# Patient Record
Sex: Male | Born: 1990 | Race: White | Hispanic: No | Marital: Single | Smoking: Never smoker
Health system: Southern US, Community
[De-identification: ages and names within clinical notes are randomized; demographics above are authoritative.]

---

## 2016-07-28 ENCOUNTER — Emergency Department (HOSPITAL_COMMUNITY): Payer: Medicare Other

## 2016-07-28 ENCOUNTER — Inpatient Hospital Stay (HOSPITAL_COMMUNITY)
Admission: EM | Admit: 2016-07-28 | Discharge: 2016-08-10 | DRG: 853 | Disposition: A | Discharge status: Nursing Home (Includes ICF) | Payer: Medicare Other | Attending: Internal Medicine | Admitting: Internal Medicine

## 2016-07-28 DIAGNOSIS — R6520 Severe sepsis without septic shock: Secondary | ICD-10-CM

## 2016-07-28 DIAGNOSIS — A419 Sepsis, unspecified organism: Secondary | ICD-10-CM | POA: Diagnosis present

## 2016-07-28 DIAGNOSIS — H6120 Impacted cerumen, unspecified ear: Secondary | ICD-10-CM | POA: Diagnosis present

## 2016-07-28 DIAGNOSIS — Z930 Tracheostomy status: Secondary | ICD-10-CM

## 2016-07-28 DIAGNOSIS — J9601 Acute respiratory failure with hypoxia: Secondary | ICD-10-CM

## 2016-07-28 DIAGNOSIS — L89154 Pressure ulcer of sacral region, stage 4: Secondary | ICD-10-CM | POA: Diagnosis not present

## 2016-07-28 DIAGNOSIS — S069X9A Unspecified intracranial injury with loss of consciousness of unspecified duration, initial encounter: Secondary | ICD-10-CM | POA: Diagnosis not present

## 2016-07-28 DIAGNOSIS — N390 Urinary tract infection, site not specified: Secondary | ICD-10-CM | POA: Diagnosis not present

## 2016-07-28 DIAGNOSIS — L89314 Pressure ulcer of right buttock, stage 4: Secondary | ICD-10-CM | POA: Diagnosis present

## 2016-07-28 DIAGNOSIS — T17990A Other foreign object in respiratory tract, part unspecified in causing asphyxiation, initial encounter: Secondary | ICD-10-CM | POA: Diagnosis not present

## 2016-07-28 DIAGNOSIS — R403 Persistent vegetative state: Secondary | ICD-10-CM | POA: Diagnosis present

## 2016-07-28 DIAGNOSIS — E440 Moderate protein-calorie malnutrition: Secondary | ICD-10-CM | POA: Diagnosis not present

## 2016-07-28 DIAGNOSIS — E873 Alkalosis: Secondary | ICD-10-CM | POA: Diagnosis present

## 2016-07-28 DIAGNOSIS — Z515 Encounter for palliative care: Secondary | ICD-10-CM

## 2016-07-28 DIAGNOSIS — J151 Pneumonia due to Pseudomonas: Secondary | ICD-10-CM | POA: Diagnosis present

## 2016-07-28 DIAGNOSIS — M62838 Other muscle spasm: Secondary | ICD-10-CM | POA: Diagnosis not present

## 2016-07-28 DIAGNOSIS — D649 Anemia, unspecified: Secondary | ICD-10-CM | POA: Diagnosis present

## 2016-07-28 DIAGNOSIS — Z9911 Dependence on respirator [ventilator] status: Secondary | ICD-10-CM | POA: Diagnosis not present

## 2016-07-28 DIAGNOSIS — Y95 Nosocomial condition: Secondary | ICD-10-CM

## 2016-07-28 DIAGNOSIS — F419 Anxiety disorder, unspecified: Secondary | ICD-10-CM | POA: Diagnosis present

## 2016-07-28 DIAGNOSIS — L89322 Pressure ulcer of left buttock, stage 2: Secondary | ICD-10-CM | POA: Diagnosis not present

## 2016-07-28 DIAGNOSIS — Z7401 Bed confinement status: Secondary | ICD-10-CM

## 2016-07-28 DIAGNOSIS — S069XAA Unspecified intracranial injury with loss of consciousness status unknown, initial encounter: Secondary | ICD-10-CM | POA: Diagnosis not present

## 2016-07-28 DIAGNOSIS — Z8614 Personal history of Methicillin resistant Staphylococcus aureus infection: Secondary | ICD-10-CM | POA: Diagnosis not present

## 2016-07-28 DIAGNOSIS — A310 Pulmonary mycobacterial infection: Secondary | ICD-10-CM | POA: Diagnosis not present

## 2016-07-28 DIAGNOSIS — E162 Hypoglycemia, unspecified: Secondary | ICD-10-CM | POA: Diagnosis not present

## 2016-07-28 DIAGNOSIS — J9621 Acute and chronic respiratory failure with hypoxia: Secondary | ICD-10-CM | POA: Diagnosis not present

## 2016-07-28 DIAGNOSIS — H6121 Impacted cerumen, right ear: Secondary | ICD-10-CM | POA: Diagnosis present

## 2016-07-28 DIAGNOSIS — J189 Pneumonia, unspecified organism: Secondary | ICD-10-CM | POA: Diagnosis present

## 2016-07-28 DIAGNOSIS — R109 Unspecified abdominal pain: Secondary | ICD-10-CM

## 2016-07-28 DIAGNOSIS — H04123 Dry eye syndrome of bilateral lacrimal glands: Secondary | ICD-10-CM | POA: Diagnosis present

## 2016-07-28 DIAGNOSIS — Y833 Surgical operation with formation of external stoma as the cause of abnormal reaction of the patient, or of later complication, without mention of misadventure at the time of the procedure: Secondary | ICD-10-CM | POA: Diagnosis not present

## 2016-07-28 DIAGNOSIS — R0603 Acute respiratory distress: Secondary | ICD-10-CM | POA: Diagnosis present

## 2016-07-28 DIAGNOSIS — J9622 Acute and chronic respiratory failure with hypercapnia: Secondary | ICD-10-CM | POA: Diagnosis present

## 2016-07-28 DIAGNOSIS — L8990 Pressure ulcer of unspecified site, unspecified stage: Secondary | ICD-10-CM | POA: Diagnosis present

## 2016-07-28 DIAGNOSIS — I471 Supraventricular tachycardia: Secondary | ICD-10-CM | POA: Diagnosis not present

## 2016-07-28 DIAGNOSIS — E876 Hypokalemia: Secondary | ICD-10-CM | POA: Diagnosis not present

## 2016-07-28 DIAGNOSIS — J9602 Acute respiratory failure with hypercapnia: Secondary | ICD-10-CM

## 2016-07-28 DIAGNOSIS — K9420 Gastrostomy complication, unspecified: Secondary | ICD-10-CM | POA: Diagnosis present

## 2016-07-28 DIAGNOSIS — Z931 Gastrostomy status: Secondary | ICD-10-CM

## 2016-07-28 DIAGNOSIS — K9423 Gastrostomy malfunction: Secondary | ICD-10-CM | POA: Diagnosis not present

## 2016-07-28 DIAGNOSIS — Z87820 Personal history of traumatic brain injury: Secondary | ICD-10-CM

## 2016-07-28 DIAGNOSIS — R0602 Shortness of breath: Secondary | ICD-10-CM

## 2016-07-28 DIAGNOSIS — K089 Disorder of teeth and supporting structures, unspecified: Secondary | ICD-10-CM

## 2016-07-28 DIAGNOSIS — J9690 Respiratory failure, unspecified, unspecified whether with hypoxia or hypercapnia: Secondary | ICD-10-CM

## 2016-07-28 LAB — CBC WITH DIFFERENTIAL/PLATELET
BASOS PCT: 0 %
Basophils Absolute: 0 10*3/uL (ref 0.0–0.1)
EOS ABS: 0.4 10*3/uL (ref 0.0–0.7)
EOS PCT: 4 %
HCT: 30.4 % — ABNORMAL LOW (ref 39.0–52.0)
HEMOGLOBIN: 9.2 g/dL — AB (ref 13.0–17.0)
Lymphocytes Relative: 11 %
Lymphs Abs: 1.1 10*3/uL (ref 0.7–4.0)
MCH: 28.2 pg (ref 26.0–34.0)
MCHC: 30.3 g/dL (ref 30.0–36.0)
MCV: 93.3 fL (ref 78.0–100.0)
MONOS PCT: 4 %
Monocytes Absolute: 0.4 10*3/uL (ref 0.1–1.0)
NEUTROS PCT: 81 %
Neutro Abs: 8.1 10*3/uL — ABNORMAL HIGH (ref 1.7–7.7)
PLATELETS: 157 10*3/uL (ref 150–400)
RBC: 3.26 MIL/uL — ABNORMAL LOW (ref 4.22–5.81)
RDW: 18.6 % — ABNORMAL HIGH (ref 11.5–15.5)
WBC: 10 10*3/uL (ref 4.0–10.5)

## 2016-07-28 LAB — I-STAT ARTERIAL BLOOD GAS, ED
ACID-BASE EXCESS: 9 mmol/L — AB (ref 0.0–2.0)
Bicarbonate: 36.3 mmol/L — ABNORMAL HIGH (ref 20.0–28.0)
O2 SAT: 99 %
PCO2 ART: 68.9 mmHg — AB (ref 32.0–48.0)
PH ART: 7.329 — AB (ref 7.350–7.450)
TCO2: 38 mmol/L (ref 0–100)
pO2, Arterial: 182 mmHg — ABNORMAL HIGH (ref 83.0–108.0)

## 2016-07-28 LAB — COMPREHENSIVE METABOLIC PANEL
ALK PHOS: 89 U/L (ref 38–126)
ALT: 10 U/L — AB (ref 17–63)
AST: 12 U/L — AB (ref 15–41)
Albumin: 2.5 g/dL — ABNORMAL LOW (ref 3.5–5.0)
Anion gap: 9 (ref 5–15)
BUN: 22 mg/dL — AB (ref 6–20)
CALCIUM: 7.9 mg/dL — AB (ref 8.9–10.3)
CO2: 30 mmol/L (ref 22–32)
CREATININE: 0.3 mg/dL — AB (ref 0.61–1.24)
Chloride: 100 mmol/L — ABNORMAL LOW (ref 101–111)
Glucose, Bld: 91 mg/dL (ref 65–99)
Potassium: 3.9 mmol/L (ref 3.5–5.1)
Sodium: 139 mmol/L (ref 135–145)
Total Bilirubin: 0.6 mg/dL (ref 0.3–1.2)
Total Protein: 6.2 g/dL — ABNORMAL LOW (ref 6.5–8.1)

## 2016-07-28 LAB — I-STAT CG4 LACTIC ACID, ED: Lactic Acid, Venous: 0.35 mmol/L — ABNORMAL LOW (ref 0.5–1.9)

## 2016-07-28 MED ORDER — DEXTROSE 5 % IV SOLN
1.0000 g | Freq: Three times a day (TID) | INTRAVENOUS | Status: AC
  Administered 2016-07-29 – 2016-08-09 (×35): 1 g via INTRAVENOUS
  Filled 2016-07-28 (×37): qty 1

## 2016-07-28 MED ORDER — DEXTROSE 5 % IV SOLN
2.0000 g | Freq: Once | INTRAVENOUS | Status: AC
  Administered 2016-07-28: 2 g via INTRAVENOUS
  Filled 2016-07-28: qty 2

## 2016-07-28 MED ORDER — SODIUM CHLORIDE 0.9 % IV SOLN
1000.0000 mL | INTRAVENOUS | Status: DC
  Administered 2016-07-28 – 2016-07-29 (×2): 1000 mL via INTRAVENOUS

## 2016-07-28 MED ORDER — LACTATED RINGERS IV BOLUS (SEPSIS)
2000.0000 mL | Freq: Once | INTRAVENOUS | Status: AC
  Administered 2016-07-28: 1000 mL via INTRAVENOUS

## 2016-07-28 MED ORDER — LINEZOLID 600 MG/300ML IV SOLN
600.0000 mg | Freq: Once | INTRAVENOUS | Status: AC
  Administered 2016-07-29: 600 mg via INTRAVENOUS
  Filled 2016-07-28: qty 300

## 2016-07-28 MED ORDER — GENTAMICIN SULFATE 40 MG/ML IJ SOLN
450.0000 mg | INTRAVENOUS | Status: DC
  Administered 2016-07-29 – 2016-08-05 (×9): 450 mg via INTRAVENOUS
  Filled 2016-07-28 (×9): qty 11.25

## 2016-07-28 MED ORDER — VANCOMYCIN HCL IN DEXTROSE 1-5 GM/200ML-% IV SOLN: 1000.0000 mg | Freq: Once | INTRAVENOUS | Status: DC

## 2016-07-28 NOTE — Progress Notes (Signed)
Pharmacy Antibiotic Note  Sean Palmer is a 26 y.o. male admitted from [name] on 07/28/2016 with pneumonia.  Pharmacy has been consulted for Cefepime and Gentamicin dosing. 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 Pt with urine colonization of CRE Kleb pneumo (last cx 3/31 - Intermediate sensitivity to gent/amikacin/tobramycin, Sensitive to Tigecycline, and resistant to all others)  Plan: Gentamicin 450 mg ( /kg) q24h Will f/u 10hr Gentamicin random level to determine dosing frequency Cefepime 2gm IV now then 1gm IV q8h Zyvox  IV ordered x 1 in ED - will f/u CCM decision on Zyvox vs Vancomycin Will f/u micro data, renal function, and pt's clinical condition Will contact [name] in a.m. to get more information about past abx courses     No data recorded.   Recent Labs Lab 07/28/16 2259 07/28/16 2314  WBC 10.0  --   CREATININE 0.30*  --   LATICACIDVEN  --  0.35*    CrCl cannot be calculated (Unknown ideal weight.).    Allergies  Allergen Reactions  . Vancomycin Other (See Comments)    redmans syndrome    Antimicrobials this admission: 4/7 Cefepime >>  4/7 Gent >> 4/7 Zyvox x1   Dose adjustments this admission: n/a  Microbiology results: 4/6 BCx x2:   Thank you for allowing pharmacy to be a part of this patient's care.  Christoper Fabian, PharmD, BCPS Clinical pharmacist, pager [phone] 07/28/2016 11:44 PM
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
## 2016-07-28 NOTE — ED Notes (Signed)
IV team starting IV and will get 2nd set of blood culture.

## 2016-07-29 DIAGNOSIS — E876 Hypokalemia: Secondary | ICD-10-CM | POA: Diagnosis not present

## 2016-07-29 DIAGNOSIS — J9602 Acute respiratory failure with hypercapnia: Secondary | ICD-10-CM

## 2016-07-29 DIAGNOSIS — J9622 Acute and chronic respiratory failure with hypercapnia: Secondary | ICD-10-CM | POA: Diagnosis not present

## 2016-07-29 DIAGNOSIS — L8990 Pressure ulcer of unspecified site, unspecified stage: Secondary | ICD-10-CM | POA: Diagnosis present

## 2016-07-29 DIAGNOSIS — D649 Anemia, unspecified: Secondary | ICD-10-CM | POA: Diagnosis not present

## 2016-07-29 DIAGNOSIS — Y95 Nosocomial condition: Secondary | ICD-10-CM | POA: Diagnosis not present

## 2016-07-29 DIAGNOSIS — E440 Moderate protein-calorie malnutrition: Secondary | ICD-10-CM | POA: Diagnosis not present

## 2016-07-29 DIAGNOSIS — L89314 Pressure ulcer of right buttock, stage 4: Secondary | ICD-10-CM | POA: Diagnosis not present

## 2016-07-29 DIAGNOSIS — Z87820 Personal history of traumatic brain injury: Secondary | ICD-10-CM | POA: Diagnosis not present

## 2016-07-29 DIAGNOSIS — J189 Pneumonia, unspecified organism: Secondary | ICD-10-CM | POA: Diagnosis not present

## 2016-07-29 DIAGNOSIS — F419 Anxiety disorder, unspecified: Secondary | ICD-10-CM | POA: Diagnosis not present

## 2016-07-29 DIAGNOSIS — A419 Sepsis, unspecified organism: Secondary | ICD-10-CM | POA: Diagnosis present

## 2016-07-29 DIAGNOSIS — N390 Urinary tract infection, site not specified: Secondary | ICD-10-CM | POA: Diagnosis not present

## 2016-07-29 DIAGNOSIS — I471 Supraventricular tachycardia: Secondary | ICD-10-CM | POA: Diagnosis not present

## 2016-07-29 DIAGNOSIS — R403 Persistent vegetative state: Secondary | ICD-10-CM | POA: Diagnosis not present

## 2016-07-29 DIAGNOSIS — J9601 Acute respiratory failure with hypoxia: Secondary | ICD-10-CM | POA: Diagnosis not present

## 2016-07-29 DIAGNOSIS — J9621 Acute and chronic respiratory failure with hypoxia: Secondary | ICD-10-CM | POA: Diagnosis present

## 2016-07-29 DIAGNOSIS — R0603 Acute respiratory distress: Secondary | ICD-10-CM | POA: Diagnosis present

## 2016-07-29 DIAGNOSIS — Y833 Surgical operation with formation of external stoma as the cause of abnormal reaction of the patient, or of later complication, without mention of misadventure at the time of the procedure: Secondary | ICD-10-CM | POA: Diagnosis not present

## 2016-07-29 DIAGNOSIS — L89154 Pressure ulcer of sacral region, stage 4: Secondary | ICD-10-CM | POA: Diagnosis not present

## 2016-07-29 DIAGNOSIS — Z9911 Dependence on respirator [ventilator] status: Secondary | ICD-10-CM | POA: Diagnosis not present

## 2016-07-29 DIAGNOSIS — K9423 Gastrostomy malfunction: Secondary | ICD-10-CM | POA: Diagnosis not present

## 2016-07-29 DIAGNOSIS — M62838 Other muscle spasm: Secondary | ICD-10-CM | POA: Diagnosis not present

## 2016-07-29 DIAGNOSIS — E873 Alkalosis: Secondary | ICD-10-CM | POA: Diagnosis not present

## 2016-07-29 DIAGNOSIS — J151 Pneumonia due to Pseudomonas: Secondary | ICD-10-CM | POA: Diagnosis not present

## 2016-07-29 LAB — CBC
HEMATOCRIT: 29.8 % — AB (ref 39.0–52.0)
HEMOGLOBIN: 8.7 g/dL — AB (ref 13.0–17.0)
MCH: 27.3 pg (ref 26.0–34.0)
MCHC: 29.2 g/dL — AB (ref 30.0–36.0)
MCV: 93.4 fL (ref 78.0–100.0)
Platelets: 152 10*3/uL (ref 150–400)
RBC: 3.19 MIL/uL — ABNORMAL LOW (ref 4.22–5.81)
RDW: 18.2 % — AB (ref 11.5–15.5)
WBC: 8.2 10*3/uL (ref 4.0–10.5)

## 2016-07-29 LAB — POCT I-STAT 3, ART BLOOD GAS (G3+)
ACID-BASE EXCESS: 8 mmol/L — AB (ref 0.0–2.0)
BICARBONATE: 33.9 mmol/L — AB (ref 20.0–28.0)
O2 SAT: 95 %
TCO2: 36 mmol/L (ref 0–100)
pCO2 arterial: 56.9 mmHg — ABNORMAL HIGH (ref 32.0–48.0)
pH, Arterial: 7.383 (ref 7.350–7.450)
pO2, Arterial: 78 mmHg — ABNORMAL LOW (ref 83.0–108.0)

## 2016-07-29 LAB — BASIC METABOLIC PANEL
ANION GAP: 11 (ref 5–15)
BUN: 19 mg/dL (ref 6–20)
CHLORIDE: 99 mmol/L — AB (ref 101–111)
CO2: 30 mmol/L (ref 22–32)
Calcium: 8.1 mg/dL — ABNORMAL LOW (ref 8.9–10.3)
Creatinine, Ser: <0.3 mg/dL — ABNORMAL LOW (ref 0.61–1.24)
GLUCOSE: 95 mg/dL (ref 65–99)
Potassium: 3.6 mmol/L (ref 3.5–5.1)
Sodium: 140 mmol/L (ref 135–145)

## 2016-07-29 LAB — HIV ANTIBODY (ROUTINE TESTING W REFLEX): HIV Screen 4th Generation wRfx: NONREACTIVE

## 2016-07-29 LAB — URINALYSIS, ROUTINE W REFLEX MICROSCOPIC
BILIRUBIN URINE: NEGATIVE
Glucose, UA: NEGATIVE mg/dL
Ketones, ur: 20 mg/dL — AB
Nitrite: NEGATIVE
PH: 8 (ref 5.0–8.0)
Protein, ur: 100 mg/dL — AB
SQUAMOUS EPITHELIAL / LPF: NONE SEEN
Specific Gravity, Urine: 1.016 (ref 1.005–1.030)

## 2016-07-29 LAB — MRSA PCR SCREENING: MRSA BY PCR: NEGATIVE

## 2016-07-29 LAB — GLUCOSE, CAPILLARY
GLUCOSE-CAPILLARY: 86 mg/dL (ref 65–99)
GLUCOSE-CAPILLARY: 104 mg/dL — AB (ref 65–99)
GLUCOSE-CAPILLARY: 89 mg/dL (ref 65–99)
Glucose-Capillary: 85 mg/dL (ref 65–99)

## 2016-07-29 LAB — MAGNESIUM
MAGNESIUM: 1.8 mg/dL (ref 1.7–2.4)
MAGNESIUM: 1.9 mg/dL (ref 1.7–2.4)

## 2016-07-29 LAB — PHOSPHORUS
Phosphorus: 3.1 mg/dL (ref 2.5–4.6)
Phosphorus: 3.4 mg/dL (ref 2.5–4.6)

## 2016-07-29 LAB — PROCALCITONIN: Procalcitonin: 0.44 ng/mL

## 2016-07-29 LAB — I-STAT CG4 LACTIC ACID, ED: LACTIC ACID, VENOUS: 0.46 mmol/L — AB (ref 0.5–1.9)

## 2016-07-29 MED ORDER — SODIUM CHLORIDE 0.9 % IV SOLN
1000.0000 mL | INTRAVENOUS | Status: DC
  Administered 2016-07-29 – 2016-07-30 (×3): 1000 mL via INTRAVENOUS

## 2016-07-29 MED ORDER — ENOXAPARIN SODIUM 40 MG/0.4ML ~~LOC~~ SOLN
40.0000 mg | SUBCUTANEOUS | Status: DC
  Administered 2016-07-29 – 2016-08-10 (×13): 40 mg via SUBCUTANEOUS
  Filled 2016-07-29 (×14): qty 0.4

## 2016-07-29 MED ORDER — CHLORHEXIDINE GLUCONATE 0.12% ORAL RINSE (MEDLINE KIT)
15.0000 mL | Freq: Two times a day (BID) | OROMUCOSAL | Status: DC
  Administered 2016-07-29 – 2016-08-10 (×24): 15 mL via OROMUCOSAL

## 2016-07-29 MED ORDER — ORAL CARE MOUTH RINSE
15.0000 mL | Freq: Four times a day (QID) | OROMUCOSAL | Status: DC
  Administered 2016-07-29 – 2016-08-10 (×49): 15 mL via OROMUCOSAL

## 2016-07-29 MED ORDER — DIAZEPAM 5 MG PO TABS
5.0000 mg | ORAL_TABLET | Freq: Four times a day (QID) | ORAL | Status: DC | PRN
  Administered 2016-07-30 – 2016-08-09 (×5): 5 mg
  Filled 2016-07-29 (×5): qty 1

## 2016-07-29 MED ORDER — IPRATROPIUM-ALBUTEROL 0.5-2.5 (3) MG/3ML IN SOLN
3.0000 mL | RESPIRATORY_TRACT | Status: DC
  Administered 2016-07-29 – 2016-08-05 (×43): 3 mL via RESPIRATORY_TRACT
  Filled 2016-07-29 (×41): qty 3

## 2016-07-29 MED ORDER — SODIUM CHLORIDE 0.9 % IV SOLN
250.0000 mL | INTRAVENOUS | Status: DC | PRN
  Administered 2016-08-04 – 2016-08-08 (×3): 250 mL via INTRAVENOUS

## 2016-07-29 MED ORDER — TOBRAMYCIN 0.3 % OP SOLN
2.0000 [drp] | Freq: Four times a day (QID) | OPHTHALMIC | Status: DC
  Administered 2016-07-30 – 2016-08-10 (×47): 2 [drp] via OPHTHALMIC
  Filled 2016-07-29 (×2): qty 5

## 2016-07-29 MED ORDER — BUDESONIDE 0.5 MG/2ML IN SUSP
0.5000 mg | Freq: Two times a day (BID) | RESPIRATORY_TRACT | Status: DC
  Administered 2016-07-29 – 2016-08-10 (×25): 0.5 mg via RESPIRATORY_TRACT
  Filled 2016-07-29 (×26): qty 2

## 2016-07-29 MED ORDER — OCTREOTIDE ACETATE 100 MCG/ML IJ SOLN
100.0000 ug | Freq: Every day | INTRAMUSCULAR | Status: DC
  Administered 2016-07-30 – 2016-08-10 (×12): 100 ug via SUBCUTANEOUS
  Filled 2016-07-29 (×14): qty 1

## 2016-07-29 MED ORDER — ACETYLCYSTEINE 20 % IN SOLN
4.0000 mL | Freq: Three times a day (TID) | RESPIRATORY_TRACT | Status: DC
  Administered 2016-07-29 – 2016-08-01 (×10): 4 mL via RESPIRATORY_TRACT
  Filled 2016-07-29 (×11): qty 4

## 2016-07-29 MED ORDER — PRO-STAT SUGAR FREE PO LIQD
30.0000 mL | Freq: Two times a day (BID) | ORAL | Status: DC
  Administered 2016-07-29: 30 mL
  Filled 2016-07-29 (×2): qty 30

## 2016-07-29 MED ORDER — PANTOPRAZOLE SODIUM 40 MG PO TBEC
40.0000 mg | DELAYED_RELEASE_TABLET | Freq: Every day | ORAL | Status: DC
  Administered 2016-07-29: 40 mg via ORAL
  Filled 2016-07-29 (×3): qty 1

## 2016-07-29 MED ORDER — LINEZOLID 600 MG/300ML IV SOLN
600.0000 mg | Freq: Two times a day (BID) | INTRAVENOUS | Status: DC
  Administered 2016-07-29 – 2016-07-30 (×4): 600 mg via INTRAVENOUS
  Filled 2016-07-29 (×6): qty 300

## 2016-07-29 MED ORDER — SODIUM CHLORIDE 0.9 % IV BOLUS (SEPSIS)
1000.0000 mL | Freq: Once | INTRAVENOUS | Status: AC
  Administered 2016-07-29: 1000 mL via INTRAVENOUS

## 2016-07-29 MED ORDER — IPRATROPIUM-ALBUTEROL 0.5-2.5 (3) MG/3ML IN SOLN
3.0000 mL | Freq: Four times a day (QID) | RESPIRATORY_TRACT | Status: DC | PRN
  Administered 2016-07-30: 3 mL via RESPIRATORY_TRACT
  Filled 2016-07-29 (×2): qty 3

## 2016-07-29 MED ORDER — BACLOFEN 10 MG PO TABS
15.0000 mg | ORAL_TABLET | Freq: Three times a day (TID) | ORAL | Status: DC
  Administered 2016-07-29 – 2016-08-10 (×28): 15 mg
  Filled 2016-07-29 (×40): qty 1

## 2016-07-29 MED ORDER — LACTATED RINGERS IV SOLN
INTRAVENOUS | Status: DC
  Administered 2016-07-29: 01:00:00 via INTRAVENOUS

## 2016-07-29 MED ORDER — VITAL HIGH PROTEIN PO LIQD
1000.0000 mL | ORAL | Status: DC
  Administered 2016-07-29: 1000 mL

## 2016-07-29 MED ORDER — FREE WATER
50.0000 mL | Freq: Four times a day (QID) | Status: DC
  Administered 2016-07-29 – 2016-08-05 (×28): 50 mL

## 2016-07-29 MED ORDER — VITAL AF 1.2 CAL PO LIQD
1000.0000 mL | ORAL | Status: DC
  Administered 2016-07-29 – 2016-08-09 (×9): 1000 mL
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
## 2016-07-29 NOTE — Progress Notes (Signed)
eLink Physician-Brief Progress Note Patient Name: Sean Palmer DOB: September 23, 1990 MRN: [id number]   Date of Service  07/29/2016  HPI/Events of Note  Nurse reports that L eye is red and has discharge. DDx: Corneal Abrasion vs Conjunctivitis.   eICU Interventions  Will order: 1. Tobramycin eye drops 2 drops to L eye Q 6 hours.     Intervention Category Major Interventions: Infection - evaluation and management  Becky Colan Eugene 07/29/2016, 9:19 PM

## 2016-07-29 NOTE — Progress Notes (Signed)
Began to perform patient CPT.  Patient was tolerating at the beginning however further into CPT, patient began to brady down and blood pressure began to drop.  CPT stopped and heart rate normalized and blood pressure returned to normal.  MD is aware.  Will continue to monitor.

## 2016-07-29 NOTE — ED Notes (Addendum)
Pt received 1 liter NS bolus at [name] before transport. Pt was given additional 1 liter of NS by [facility] en route.  Per Dr. Rhunette Croft give 1 liter LR bolus and the 2nd liter LR as maintenance at 125 mL/hr

## 2016-07-29 NOTE — Progress Notes (Signed)
CPT not performed due to Patient have bradycardic episodes into the 40's. MD aware. RT will continue to monitor.
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
## 2016-07-30 ENCOUNTER — Inpatient Hospital Stay (HOSPITAL_COMMUNITY): Payer: Medicare Other

## 2016-07-30 ENCOUNTER — Encounter (HOSPITAL_COMMUNITY): Payer: Self-pay | Admitting: *Deleted

## 2016-07-30 DIAGNOSIS — Z9911 Dependence on respirator [ventilator] status: Secondary | ICD-10-CM | POA: Diagnosis not present

## 2016-07-30 DIAGNOSIS — R0603 Acute respiratory distress: Secondary | ICD-10-CM | POA: Diagnosis not present

## 2016-07-30 DIAGNOSIS — A419 Sepsis, unspecified organism: Secondary | ICD-10-CM | POA: Diagnosis not present

## 2016-07-30 DIAGNOSIS — J189 Pneumonia, unspecified organism: Secondary | ICD-10-CM | POA: Diagnosis not present

## 2016-07-30 DIAGNOSIS — J9601 Acute respiratory failure with hypoxia: Secondary | ICD-10-CM | POA: Diagnosis not present

## 2016-07-30 DIAGNOSIS — J9602 Acute respiratory failure with hypercapnia: Secondary | ICD-10-CM | POA: Diagnosis not present

## 2016-07-30 LAB — GLUCOSE, CAPILLARY
GLUCOSE-CAPILLARY: 84 mg/dL (ref 65–99)
GLUCOSE-CAPILLARY: 91 mg/dL (ref 65–99)
GLUCOSE-CAPILLARY: 115 mg/dL — AB (ref 65–99)
GLUCOSE-CAPILLARY: 155 mg/dL — AB (ref 65–99)
Glucose-Capillary: 70 mg/dL (ref 65–99)
Glucose-Capillary: 76 mg/dL (ref 65–99)

## 2016-07-30 LAB — BASIC METABOLIC PANEL
ANION GAP: 8 (ref 5–15)
BUN: 13 mg/dL (ref 6–20)
CHLORIDE: 102 mmol/L (ref 101–111)
CO2: 32 mmol/L (ref 22–32)
Calcium: 8.1 mg/dL — ABNORMAL LOW (ref 8.9–10.3)
Creatinine, Ser: <0.3 mg/dL — ABNORMAL LOW (ref 0.61–1.24)
GLUCOSE: 89 mg/dL (ref 65–99)
POTASSIUM: 3 mmol/L — AB (ref 3.5–5.1)
Sodium: 142 mmol/L (ref 135–145)

## 2016-07-30 LAB — CBC
HCT: 26.1 % — ABNORMAL LOW (ref 39.0–52.0)
Hemoglobin: 7.8 g/dL — ABNORMAL LOW (ref 13.0–17.0)
MCH: 27.5 pg (ref 26.0–34.0)
MCHC: 29.9 g/dL — ABNORMAL LOW (ref 30.0–36.0)
MCV: 91.9 fL (ref 78.0–100.0)
Platelets: 128 10*3/uL — ABNORMAL LOW (ref 150–400)
RBC: 2.84 MIL/uL — AB (ref 4.22–5.81)
RDW: 18 % — ABNORMAL HIGH (ref 11.5–15.5)
WBC: 3.8 10*3/uL — AB (ref 4.0–10.5)

## 2016-07-30 LAB — POCT I-STAT 3, ART BLOOD GAS (G3+)
ACID-BASE EXCESS: 12 mmol/L — AB (ref 0.0–2.0)
Bicarbonate: 34 mmol/L — ABNORMAL HIGH (ref 20.0–28.0)
O2 SAT: 99 %
PCO2 ART: 34.5 mmHg (ref 32.0–48.0)
PH ART: 7.602 — AB (ref 7.350–7.450)
Patient temperature: 98.7
TCO2: 35 mmol/L (ref 0–100)
pO2, Arterial: 122 mmHg — ABNORMAL HIGH (ref 83.0–108.0)

## 2016-07-30 LAB — PROCALCITONIN: Procalcitonin: 0.74 ng/mL

## 2016-07-30 LAB — PHOSPHORUS
PHOSPHORUS: 3.1 mg/dL (ref 2.5–4.6)
Phosphorus: 3.4 mg/dL (ref 2.5–4.6)

## 2016-07-30 LAB — GENTAMICIN LEVEL, RANDOM: GENTAMICIN RM: 1.4 ug/mL

## 2016-07-30 LAB — MAGNESIUM
MAGNESIUM: 1.5 mg/dL — AB (ref 1.7–2.4)
Magnesium: 1.8 mg/dL (ref 1.7–2.4)

## 2016-07-30 MED ORDER — CHLORHEXIDINE GLUCONATE CLOTH 2 % EX PADS
6.0000 | MEDICATED_PAD | Freq: Every day | CUTANEOUS | Status: DC
Start: 2016-07-30 — End: 2016-08-10
  Administered 2016-07-30 – 2016-08-10 (×10): 6 via TOPICAL

## 2016-07-30 MED ORDER — FENTANYL CITRATE (PF) 100 MCG/2ML IJ SOLN
75.0000 ug | INTRAMUSCULAR | Status: DC | PRN
  Administered 2016-07-30 – 2016-08-08 (×9): 75 ug via INTRAVENOUS
  Filled 2016-07-30 (×10): qty 2

## 2016-07-30 MED ORDER — MIDAZOLAM HCL 2 MG/2ML IJ SOLN
2.0000 mg | Freq: Once | INTRAMUSCULAR | Status: AC
  Administered 2016-07-30: 2 mg via INTRAVENOUS

## 2016-07-30 MED ORDER — POTASSIUM CHLORIDE 20 MEQ/15ML (10%) PO SOLN
40.0000 meq | Freq: Once | ORAL | Status: AC
  Administered 2016-07-30: 40 meq
  Filled 2016-07-30: qty 30

## 2016-07-30 MED ORDER — PANTOPRAZOLE SODIUM 40 MG PO PACK
40.0000 mg | PACK | Freq: Every day | ORAL | Status: DC
Start: 2016-07-30 — End: 2016-08-10
  Administered 2016-07-30 – 2016-08-10 (×10): 40 mg
  Filled 2016-07-30 (×11): qty 20

## 2016-07-30 MED ORDER — FENTANYL CITRATE (PF) 100 MCG/2ML IJ SOLN
100.0000 ug | Freq: Once | INTRAMUSCULAR | Status: AC
  Administered 2016-07-30: 100 ug via INTRAVENOUS

## 2016-07-30 MED ORDER — SODIUM CHLORIDE 0.9 % IV SOLN
1.0000 g | Freq: Once | INTRAVENOUS | Status: AC
  Administered 2016-07-30: 1 g via INTRAVENOUS
  Filled 2016-07-30: qty 10

## 2016-07-30 MED ORDER — MAGNESIUM SULFATE 2 GM/50ML IV SOLN
2.0000 g | Freq: Once | INTRAVENOUS | Status: AC
  Administered 2016-07-30: 2 g via INTRAVENOUS
  Filled 2016-07-30: qty 50

## 2016-07-30 MED ORDER — MIDAZOLAM HCL 2 MG/2ML IJ SOLN
INTRAMUSCULAR | Status: AC
  Filled 2016-07-30: qty 2

## 2016-07-30 MED ORDER — CALCIUM GLUCONATE 10 % IV SOLN: 1.0000 g | Freq: Once | INTRAVENOUS | Status: DC

## 2016-07-30 NOTE — Progress Notes (Signed)
Mag 1.5, ordered 2 g mag sulfate.
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
## 2016-07-30 NOTE — Procedures (Signed)
Bronchoscopy Procedure Note Sean Palmer [id number] [birth year]  Procedure: Bronchoscopy Indications: Diagnostic evaluation of the airways.    Pt with chronic trache, with HCAP, white out left lung.  R/O lung collapse.   Procedure Details Consent: Risks of procedure as well as the alternatives and risks of each were explained to the (patient/caregiver).  Consent for procedure obtained. Time Out: Verified patient identification, verified procedure, site/side was marked, verified correct patient position, special equipment/implants available, medications/allergies/relevent history reviewed, required imaging and test results available.  Performed  In preparation for procedure, patient was given 100% FiO2 and bronchoscope lubricated. Sedation: Benzodiazepines  Airway entered and the following bronchi were examined: RUL, RML, RLL, LUL and Bronchi.   LUL and lingula were papent. Purulent secretions suctioned out from L lung.  LL lobectomy stump seen.  R lung was patnet (RUL, RML, RLL).  Purulent secretions were also seen and suctioned out.   Procedures performed: BAL from lingula.  Bronchoscope removed.    Evaluation Hemodynamic Status: BP stable throughout; O2 sats: stable throughout Patient's Current Condition: stable Specimens:  Sent purulent fluid Complications: No apparent complications Patient did tolerate procedure well.   [place] 07/30/2016

## 2016-07-30 NOTE — Progress Notes (Signed)
Pt skin surrounding G/J tube is red, warm, and has purulent/ black drainage. MD at bedside and aware. Dressing changed. Will continue to monitor.  Roselie Awkward, RN

## 2016-07-30 NOTE — Progress Notes (Signed)
Pharmacy Antibiotic Note  Sean Palmer is a 26 y.o. male admitted from [facility] on 07/28/2016 with pneumonia.  Pharmacy has been consulted for Cefepime and Gentamicin dosing. 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  Gentamicin level was drawn 10 hours after dose and is 1.4 mcg/mL. Will continue gentamicin at q24h dosing based on [place] [facility].  Plan: Gentamicin 450 mg (24m/kg) q24h Cefepime 1gm IV q8h Zyvox 6040mIV q12h Will f/u micro data, renal function, and pt's clinical condition  Height: _0  (162.6 cm) Weight: 140 lb 6.9 oz (63.7 kg) IBW/kg (Calculated) : 59.2  Temp (24hrs), Avg:98 F (36.7 C), Min:97.2 F (36.2 C), Max:98.7 F (37.1 C)   Recent Labs Lab 07/28/16 2259 07/28/16 2314 07/29/16 0221 07/29/16 0240 07/30/16 0414 07/30/16 1004  WBC 10.0  --  8.2  --  3.8*  --   CREATININE 0.30*  --  <0.30*  --  <0.30*  --   LATICACIDVEN  --  0.35*  --  0.46*  --   --   GENTRANDOM  --   --   --   --   --  1.4    CrCl cannot be calculated (This lab value cannot be used to calculate CrCl because it is not a number: <0.30).    Allergies  Allergen Reactions  . Vancomycin Other (See Comments)    redmans syndrome    Antimicrobials this admission: 4/7 Cefepime >>  4/7 Gent >> 4/7 Zyvox >>  Dose adjustments this admission: 4/8 Gent Level: 1.4 mcg/mL - continue q24h dosing  Microbiology results: 4/6 BCx x2: 1/2 GPC in clusters 4/7 TA: pending 4/7 MRSA PCR: neg 4/8 BAL: pending  CoAndrey CotaBaDiona FoleyPharmD, BCPS Clinical Pharmacist #[id number]/11/2016 1:47 PM

## 2016-07-30 NOTE — Progress Notes (Signed)
eLink Physician-Brief Progress Note Patient Name: Sean Palmer DOB: 1990/06/20 MRN: [id number]   Date of Service  07/30/2016  HPI/Events of Note  Metabolic alkalosis with pH 7.6/34/122/34  eICU Interventions  Plan: Decrease RR on vent to 18     Intervention Category Major Interventions: Acid-Base disturbance - evaluation and management  DETERDING,ELIZABETH 07/30/2016, 4:23 AM

## 2016-07-31 ENCOUNTER — Inpatient Hospital Stay (HOSPITAL_COMMUNITY): Payer: Medicare Other

## 2016-07-31 DIAGNOSIS — J9601 Acute respiratory failure with hypoxia: Secondary | ICD-10-CM | POA: Diagnosis not present

## 2016-07-31 DIAGNOSIS — R0603 Acute respiratory distress: Secondary | ICD-10-CM | POA: Diagnosis not present

## 2016-07-31 DIAGNOSIS — J9602 Acute respiratory failure with hypercapnia: Secondary | ICD-10-CM | POA: Diagnosis not present

## 2016-07-31 DIAGNOSIS — A419 Sepsis, unspecified organism: Secondary | ICD-10-CM | POA: Diagnosis not present

## 2016-07-31 LAB — CBC
HCT: 26.7 % — ABNORMAL LOW (ref 39.0–52.0)
Hemoglobin: 8 g/dL — ABNORMAL LOW (ref 13.0–17.0)
MCH: 27.9 pg (ref 26.0–34.0)
MCHC: 30 g/dL (ref 30.0–36.0)
MCV: 93 fL (ref 78.0–100.0)
PLATELETS: 139 10*3/uL — AB (ref 150–400)
RBC: 2.87 MIL/uL — ABNORMAL LOW (ref 4.22–5.81)
RDW: 18.3 % — AB (ref 11.5–15.5)
WBC: 4.3 10*3/uL (ref 4.0–10.5)

## 2016-07-31 LAB — BLOOD GAS, ARTERIAL
Acid-Base Excess: 7.4 mmol/L — ABNORMAL HIGH (ref 0.0–2.0)
Bicarbonate: 33 mmol/L — ABNORMAL HIGH (ref 20.0–28.0)
Drawn by: 24513
FIO2: 40
LHR: 16 {breaths}/min
O2 SAT: 95.5 %
PATIENT TEMPERATURE: 98.3
PCO2 ART: 61.2 mmHg — AB (ref 32.0–48.0)
PEEP: 5 cmH2O
VT: 500 mL
pH, Arterial: 7.35 (ref 7.350–7.450)
pO2, Arterial: 77.6 mmHg — ABNORMAL LOW (ref 83.0–108.0)

## 2016-07-31 LAB — GLUCOSE, CAPILLARY
GLUCOSE-CAPILLARY: 85 mg/dL (ref 65–99)
GLUCOSE-CAPILLARY: 94 mg/dL (ref 65–99)
GLUCOSE-CAPILLARY: 74 mg/dL (ref 65–99)
GLUCOSE-CAPILLARY: 75 mg/dL (ref 65–99)
Glucose-Capillary: 65 mg/dL (ref 65–99)
Glucose-Capillary: 182 mg/dL — ABNORMAL HIGH (ref 65–99)
Glucose-Capillary: 93 mg/dL (ref 65–99)

## 2016-07-31 LAB — BASIC METABOLIC PANEL
Anion gap: 6 (ref 5–15)
BUN: 8 mg/dL (ref 6–20)
CALCIUM: 8.1 mg/dL — AB (ref 8.9–10.3)
CO2: 33 mmol/L — ABNORMAL HIGH (ref 22–32)
Chloride: 104 mmol/L (ref 101–111)
GLUCOSE: 106 mg/dL — AB (ref 65–99)
Potassium: 3 mmol/L — ABNORMAL LOW (ref 3.5–5.1)
Sodium: 143 mmol/L (ref 135–145)

## 2016-07-31 LAB — PHOSPHORUS: Phosphorus: 3.9 mg/dL (ref 2.5–4.6)

## 2016-07-31 LAB — MAGNESIUM: Magnesium: 2.6 mg/dL — ABNORMAL HIGH (ref 1.7–2.4)

## 2016-07-31 LAB — PROCALCITONIN: Procalcitonin: 0.64 ng/mL

## 2016-07-31 MED ORDER — ACETAMINOPHEN 160 MG/5ML PO SOLN
650.0000 mg | ORAL | Status: DC | PRN
  Administered 2016-07-31 – 2016-08-04 (×2): 650 mg
  Filled 2016-07-31 (×2): qty 20.3

## 2016-07-31 MED ORDER — DEXTROSE 50 % IV SOLN
25.0000 g | Freq: Once | INTRAVENOUS | Status: AC
Start: 2016-07-31 — End: 2016-07-31
  Administered 2016-07-31: 25 g via INTRAVENOUS
  Filled 2016-07-31: qty 50

## 2016-07-31 MED ORDER — DEXTROSE-NACL 5-0.45 % IV SOLN
INTRAVENOUS | Status: DC
  Administered 2016-07-31 – 2016-08-01 (×2): via INTRAVENOUS

## 2016-07-31 MED ORDER — DEXTROSE 50 % IV SOLN: 1.0000 | Freq: Once | INTRAVENOUS | Status: AC

## 2016-07-31 MED ORDER — POTASSIUM CHLORIDE 20 MEQ/15ML (10%) PO SOLN
40.0000 meq | Freq: Two times a day (BID) | ORAL | Status: DC
  Administered 2016-07-31 – 2016-08-03 (×7): 40 meq
  Filled 2016-07-31 (×7): qty 30

## 2016-07-31 NOTE — Progress Notes (Signed)
Report given to mary beth rn at this time

## 2016-07-31 NOTE — Progress Notes (Signed)
STAFF NOTE: I, Dr Lavinia Sharps have personally reviewed patient's available data, including medical history, events of note, physical examination and test results as part of my evaluation. I have discussed with resident/NP and other care providers such as pharmacist, RN and RRT.  In addition,  I personally evaluated patient and elicited key findings of   S: increaed resp secretions needing higher fio2. SBP soft MAP 60 with HR 120 sinus; unclear baseline. Growwing pesudomonas on resp cutlure pre-bronch. Hypoglycemic this AM  O: unresponsivle - not on sedation. Seems less alert per resident On vent Contracttures  cxr - left sided consolidation - chrnic with Rt comp hyperinfilation and possible RUL opatcityy   Recent Labs Lab 07/29/16 0221 07/30/16 0414 07/31/16 0315  HGB 8.7* 7.8* 8.0*  HCT 29.8* 26.1* 26.7*  WBC 8.2 3.8* 4.3  PLT 152 128* 139*    Recent Labs Lab 07/28/16 2259 07/29/16 0221 07/29/16 1000 07/29/16 1700 07/30/16 0414 07/30/16 1517 07/31/16 0315  NA 139 140  --   --  142  --  143  K 3.9 3.6  --   --  3.0*  --  3.0*  CL 100* 99*  --   --  102  --  104  CO2 30 30  --   --  32  --  33*  GLUCOSE 91 95  --   --  89  --  106*  BUN 22* 19  --   --  13  --  8  CREATININE 0.30* <0.30*  --   --  <0.30*  --  <0.30*  CALCIUM 7.9* 8.1*  --   --  8.1*  --  8.1*  MG  --   --  1.8 1.9 1.8 1.5* 2.6*  PHOS  --   --  3.1 3.4 3.4 3.1 3.9     A: Hypokalemia - replaced Acute on chronic resp failure - likely pseudomonas  Hypotension - improved but unclear resolved Hypoglycemia  P: continue full vnet  pulm toilet Assess baseline BP by calling [facility] and Rx hypotension as needed Correct low sugar  Keep in 1 ICU 1 more day   .  Rest per NP/medical resident whose note is outlined above and that I agree with  The patient is critically ill with multiple organ systems failure and requires high complexity decision making for assessment and support, frequent evaluation and  titration of therapies, application of advanced monitoring technologies and extensive interpretation of multiple databases.   Critical Care Time devoted to patient care services described in this note is  30  Minutes. This time reflects time of care of this signee Dr Kalman Shan. This critical care time does not reflect procedure time, or teaching time or supervisory time of PA/NP/Med student/Med Resident etc but could involve care discussion time    Dr. Kalman Shan, M.D., [facility].C.P Pulmonary and Critical Care Medicine Staff Physician [facility] [place] [facility] Critical [facility] Pager: [phone], If no answer or between  15:00h - 7:00h: call [phone]  07/31/2016 9:11 AM

## 2016-07-31 NOTE — Care Management (Signed)
Pt is not appropriate for LTACH.  Pt is from longterm placement at [facility] - CSW consulted regarding family request of moving pt closer to [facility]
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
## 2016-07-31 NOTE — Progress Notes (Signed)
   07/31/16 2323  Respiratory Severity Assessment  Respiratory History 0  Breath Sounds 3  Respiratory Pattern 0  Cough 2  Chest X Ray 3  O2 Requirements 2  Level of Activity 3  Dyspnea 0  Score Total 13  Aerosolized Bronchodilators  Aerosolized bronchodilator indications Aerosolized bronchodilator indicated;Bronchospasm/Wheezing  Aerosolized bronchodilator goals Bronchodilation;Relief of shortness of SOB;Reduce WOB  Plan of care Hand held neb treatment  Oxygen Therapy  Oxygen therapy indications Oxygen therapy indicated;SOB/Increased WOB;Hypoxia;Hypoxemia  Oxygen therapy goals Treat hypoxia/hypoxemia;Treat myocardial O2 demand;Decrease WOB;Alleviate SOB  Plan of care (vent)  Respiratory Therapy Follow Up Assessment  Assessment follow up date 08/01/16  Follow-up assessment complete Yes

## 2016-07-31 NOTE — Consult Note (Addendum)
WOC Nurse wound consult note Reason for Consult: Consult requested for multiple pressure injuries. Wound type: Right inner ankle deep tissue injury; 1X1cm dark reddish purple Right outer ankle deep tissue injury; .3X.3 cm dark reddish purple Right heel deep tissue injury; .3X.3 cm dark reddish purple Left heel deep tissue injury; 1X.5 cm dark reddish purple Sacrum with chronic stage 4 pressure injury; 3.5X5X1cm,, undermining to 2 cm circumferentially. 90% red, 10% yellow, bleeds easily when touched, bone palpable with swab; mod amt tan drainage, no odor Left ischium with unstageable wound to upper location; 1X1cm 100% yellow, no odor, small amt yellow drainage Left lower ischium with stage 2 pressure injury; 2X1X.1cm, pink and moist Right ischium with chronic stage 4 pressure injury; 3.5X3,5X1cm with 1 cm undermining, mod amt tan drainage, 90% red, 10% yellow, no odor, bone palpable with swab G-tube with full thickness erosion around the insertion site; 1.8X1X.2cm, red and moist, mod amt tan drainage, no odor Pressure Injury POA: Yes Dressing procedure/placement/frequency: Pt is on a Sport low airloss bed to reduce pressure.   Foam dressings to protect heels and ankles and left ischium.   Prevalon boots on to reduce pressure to BLE.   Aquacel to absorb drainage and provide antimicrobial benefits to sacrum and right ischium and G-tube site.  No family at bedside to discuss plan of care. Please re-consult if further assistance is needed.  Thank-you,  Cammie Mcgee MSN, RN, CWOCN, [place], CNS [phone]
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
## 2016-08-01 ENCOUNTER — Inpatient Hospital Stay (HOSPITAL_COMMUNITY): Payer: Medicare Other

## 2016-08-01 DIAGNOSIS — A419 Sepsis, unspecified organism: Secondary | ICD-10-CM | POA: Diagnosis not present

## 2016-08-01 DIAGNOSIS — J189 Pneumonia, unspecified organism: Secondary | ICD-10-CM | POA: Diagnosis present

## 2016-08-01 DIAGNOSIS — Y95 Nosocomial condition: Secondary | ICD-10-CM

## 2016-08-01 DIAGNOSIS — J9602 Acute respiratory failure with hypercapnia: Secondary | ICD-10-CM | POA: Diagnosis not present

## 2016-08-01 DIAGNOSIS — R0603 Acute respiratory distress: Secondary | ICD-10-CM | POA: Diagnosis not present

## 2016-08-01 DIAGNOSIS — J9601 Acute respiratory failure with hypoxia: Secondary | ICD-10-CM | POA: Diagnosis not present

## 2016-08-01 LAB — CBC
HCT: 27.3 % — ABNORMAL LOW (ref 39.0–52.0)
Hemoglobin: 8 g/dL — ABNORMAL LOW (ref 13.0–17.0)
MCH: 27.5 pg (ref 26.0–34.0)
MCHC: 29.3 g/dL — AB (ref 30.0–36.0)
MCV: 93.8 fL (ref 78.0–100.0)
PLATELETS: 143 10*3/uL — AB (ref 150–400)
RBC: 2.91 MIL/uL — AB (ref 4.22–5.81)
RDW: 18.1 % — ABNORMAL HIGH (ref 11.5–15.5)
WBC: 6 10*3/uL (ref 4.0–10.5)

## 2016-08-01 LAB — BASIC METABOLIC PANEL
Anion gap: 6 (ref 5–15)
BUN: 7 mg/dL (ref 6–20)
CO2: 35 mmol/L — ABNORMAL HIGH (ref 22–32)
Calcium: 8.2 mg/dL — ABNORMAL LOW (ref 8.9–10.3)
Chloride: 100 mmol/L — ABNORMAL LOW (ref 101–111)
Glucose, Bld: 101 mg/dL — ABNORMAL HIGH (ref 65–99)
Potassium: 3.7 mmol/L (ref 3.5–5.1)
SODIUM: 141 mmol/L (ref 135–145)

## 2016-08-01 LAB — CULTURE, BLOOD (ROUTINE X 2): SPECIAL REQUESTS: ADEQUATE

## 2016-08-01 LAB — POCT I-STAT 3, ART BLOOD GAS (G3+)
ACID-BASE EXCESS: 14 mmol/L — AB (ref 0.0–2.0)
ACID-BASE EXCESS: 14 mmol/L — AB (ref 0.0–2.0)
Bicarbonate: 43.4 mmol/L — ABNORMAL HIGH (ref 20.0–28.0)
Bicarbonate: 42 mmol/L — ABNORMAL HIGH (ref 20.0–28.0)
O2 SAT: 96 %
O2 Saturation: 100 %
PCO2 ART: 89.8 mmHg — AB (ref 32.0–48.0)
PH ART: 7.294 — AB (ref 7.350–7.450)
PH ART: 7.336 — AB (ref 7.350–7.450)
PO2 ART: 405 mmHg — AB (ref 83.0–108.0)
Patient temperature: 98.9
TCO2: 46 mmol/L (ref 0–100)
TCO2: 44 mmol/L (ref 0–100)
pCO2 arterial: 78.7 mmHg (ref 32.0–48.0)
pO2, Arterial: 94 mmHg (ref 83.0–108.0)

## 2016-08-01 LAB — GLUCOSE, CAPILLARY
GLUCOSE-CAPILLARY: 101 mg/dL — AB (ref 65–99)
GLUCOSE-CAPILLARY: 133 mg/dL — AB (ref 65–99)
GLUCOSE-CAPILLARY: 178 mg/dL — AB (ref 65–99)
Glucose-Capillary: 86 mg/dL (ref 65–99)
Glucose-Capillary: 85 mg/dL (ref 65–99)

## 2016-08-01 LAB — MAGNESIUM: MAGNESIUM: 2 mg/dL (ref 1.7–2.4)

## 2016-08-01 LAB — PHOSPHORUS: Phosphorus: 2.8 mg/dL (ref 2.5–4.6)

## 2016-08-01 MED ORDER — ETOMIDATE 2 MG/ML IV SOLN
20.0000 mg | Freq: Once | INTRAVENOUS | Status: AC
  Administered 2016-08-01: 20 mg via INTRAVENOUS

## 2016-08-01 MED ORDER — IOPAMIDOL (ISOVUE-300) INJECTION 61%
INTRAVENOUS | Status: AC
  Filled 2016-08-01: qty 100

## 2016-08-01 MED ORDER — ACETYLCYSTEINE 20 % IN SOLN
4.0000 mL | Freq: Two times a day (BID) | RESPIRATORY_TRACT | Status: DC
  Administered 2016-08-01 – 2016-08-02 (×2): 4 mL via RESPIRATORY_TRACT
  Filled 2016-08-01 (×2): qty 4

## 2016-08-01 MED ORDER — ETOMIDATE 2 MG/ML IV SOLN
INTRAVENOUS | Status: AC
  Filled 2016-08-01: qty 10

## 2016-08-01 MED ORDER — ACETYLCYSTEINE 20 % IN SOLN
4.0000 mL | Freq: Two times a day (BID) | RESPIRATORY_TRACT | Status: DC
  Filled 2016-08-01: qty 4
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
## 2016-08-01 NOTE — Progress Notes (Signed)
VT changed to 450 per Dr. Christene Slates.

## 2016-08-01 NOTE — Progress Notes (Signed)
CPT not done at this time due to patient being cleaned up by RN.

## 2016-08-01 NOTE — Procedures (Signed)
Bedside Bronchoscopy Procedure Note YARDLEY LEKAS [id number] 1990/08/28  Procedure: Bronchoscopy Indications: Diagnostic evaluation of the airways and Remove secretions  Procedure Details: #8portex: Bite block in place: No In preparation for procedure, Patient hyper-oxygenated with 100 % FiO2 and Saline given via ETT (60 ml) Airway entered and the following bronchi were examined: RUL, RML, RLL, LUL, LLL and Bronchi.   Bronchoscope removed.    Evaluation BP (!) 105/56 (BP Location: Right Leg)   Pulse (!) 102   Temp 98.3 F (36.8 C) (Oral)   Resp 18   Ht  (1.626 m)   Wt 141 lb 15.6 oz (64.4 kg)   SpO2 96%   BMI 24.37 kg/m  Breath Sounds:Rhonch O2 sats: stable throughout Patient's Current Condition: stable Specimens:  None Complications: No apparent complications Patient did tolerate procedure well.   Cherylin Mylar 08/01/2016, 10:25 AM

## 2016-08-01 NOTE — Progress Notes (Signed)
   [facility] PCCM  > RT calls me re: abnormal abg >> 7.29/90/405 on 100%. Had mucus plug and hypoxemia this am requiring a clean out bronch.  > TV was decreased as well as RR > FiO2 decreased to 60% and o2 sats are 97%.  His MV is 10. Will increase TV to 450. Rpt abg in 1 hr.   Pollie Meyer, MD 08/01/2016, 12:39 PM [facility] Pager [phone] After 3 pm or if no answer, call [phone]

## 2016-08-01 NOTE — Progress Notes (Signed)
eLink Physician-Brief Progress Note Patient Name: AMIEL MCCAFFREY DOB: 1990-12-13 MRN: [id number]   Date of Service  08/01/2016  HPI/Events of Note  concer leakage j/g tube  eICU Interventions  Dc use Get film with contrast and assess placement     Intervention Category Intermediate Interventions: Diagnostic test evaluation  Nelda Bucks. 08/01/2016, 3:35 PM

## 2016-08-01 NOTE — Progress Notes (Signed)
This note also relates to the following rows which could not be included: SpO2 - Cannot attach notes to unvalidated device data  Changes made per MD request
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
## 2016-08-01 NOTE — Procedures (Addendum)
Bronchoscopy Procedure Note TABITHA TUPPER [id number] Jan 19, 1991  Procedure: Bronchoscopy Indications: Remove secretions  Procedure Details Consent: Unable to obtain consent because of emergent medical necessity. Time Out: Verified patient identification, verified procedure, site/side was marked, verified correct patient position, special equipment/implants available, medications/allergies/relevent history reviewed, required imaging and test results available.  Performed  In preparation for procedure, patient was given 100% FiO2. Sedation: Etomidate  Airway entered and the following bronchi were examined: RUL, RML, RLL, LLL and Bronchi.   Procedures performed: Brushings performed- NONE Bronchoscope removed.  , Patient placed back on 100% FiO2 at conclusion of procedure.    Evaluation Hemodynamic Status: BP stable throughout; O2 sats: stable throughout Patient's Current Condition: stable Specimens:  None Complications: No apparent complications Patient did tolerate procedure well.   Nelda Bucks. 08/01/2016   Emergent need, desat, concern obstruction  1. Complete collapse mucous LEFT MAIN, Left upper division, lavaged and cleared 2. Clips at stump LLL 3. Mucous mild rt main and BI  Mcarthur Rossetti. Tyson Alias, MD, FACP Pgr: [phone] [facility]

## 2016-08-02 ENCOUNTER — Inpatient Hospital Stay (HOSPITAL_COMMUNITY): Payer: Medicare Other

## 2016-08-02 DIAGNOSIS — A419 Sepsis, unspecified organism: Secondary | ICD-10-CM | POA: Diagnosis not present

## 2016-08-02 DIAGNOSIS — J9602 Acute respiratory failure with hypercapnia: Secondary | ICD-10-CM | POA: Diagnosis not present

## 2016-08-02 DIAGNOSIS — R0603 Acute respiratory distress: Secondary | ICD-10-CM | POA: Diagnosis not present

## 2016-08-02 DIAGNOSIS — J9601 Acute respiratory failure with hypoxia: Secondary | ICD-10-CM | POA: Diagnosis not present

## 2016-08-02 DIAGNOSIS — J189 Pneumonia, unspecified organism: Secondary | ICD-10-CM | POA: Diagnosis not present

## 2016-08-02 LAB — CBC
HCT: 27.4 % — ABNORMAL LOW (ref 39.0–52.0)
Hemoglobin: 8.1 g/dL — ABNORMAL LOW (ref 13.0–17.0)
MCH: 27.4 pg (ref 26.0–34.0)
MCHC: 29.6 g/dL — AB (ref 30.0–36.0)
MCV: 92.6 fL (ref 78.0–100.0)
PLATELETS: 124 10*3/uL — AB (ref 150–400)
RBC: 2.96 MIL/uL — ABNORMAL LOW (ref 4.22–5.81)
RDW: 17.9 % — AB (ref 11.5–15.5)
WBC: 5.3 10*3/uL (ref 4.0–10.5)

## 2016-08-02 LAB — BASIC METABOLIC PANEL
ANION GAP: 5 (ref 5–15)
BUN: 8 mg/dL (ref 6–20)
CALCIUM: 8.5 mg/dL — AB (ref 8.9–10.3)
CO2: 37 mmol/L — ABNORMAL HIGH (ref 22–32)
Chloride: 95 mmol/L — ABNORMAL LOW (ref 101–111)
Glucose, Bld: 109 mg/dL — ABNORMAL HIGH (ref 65–99)
Potassium: 4.2 mmol/L (ref 3.5–5.1)
SODIUM: 137 mmol/L (ref 135–145)

## 2016-08-02 LAB — GLUCOSE, CAPILLARY
GLUCOSE-CAPILLARY: 110 mg/dL — AB (ref 65–99)
GLUCOSE-CAPILLARY: 119 mg/dL — AB (ref 65–99)
GLUCOSE-CAPILLARY: 80 mg/dL (ref 65–99)
GLUCOSE-CAPILLARY: 84 mg/dL (ref 65–99)
Glucose-Capillary: 106 mg/dL — ABNORMAL HIGH (ref 65–99)
Glucose-Capillary: 164 mg/dL — ABNORMAL HIGH (ref 65–99)
Glucose-Capillary: 132 mg/dL — ABNORMAL HIGH (ref 65–99)

## 2016-08-02 LAB — PHOSPHORUS: Phosphorus: 2.5 mg/dL (ref 2.5–4.6)

## 2016-08-02 LAB — MAGNESIUM: Magnesium: 1.9 mg/dL (ref 1.7–2.4)

## 2016-08-02 MED ORDER — ACETYLCYSTEINE 20 % IN SOLN
4.0000 mL | Freq: Three times a day (TID) | RESPIRATORY_TRACT | Status: DC
  Administered 2016-08-02 – 2016-08-09 (×21): 4 mL via RESPIRATORY_TRACT
  Filled 2016-08-02 (×21): qty 4

## 2016-08-02 MED ORDER — MIDAZOLAM HCL 2 MG/2ML IJ SOLN
2.0000 mg | Freq: Once | INTRAMUSCULAR | Status: AC
  Administered 2016-08-02: 2 mg via INTRAVENOUS

## 2016-08-02 MED ORDER — MIDAZOLAM HCL 2 MG/2ML IJ SOLN
INTRAMUSCULAR | Status: AC
  Filled 2016-08-02: qty 2

## 2016-08-02 NOTE — Procedures (Signed)
Bronchoscopy Procedure Note Sean Palmer [id number] December 16, 1990  Procedure: Bronchoscopy Indications: Diagnostic evaluation of the airways  Procedure Details Consent: Risks of procedure as well as the alternatives and risks of each were explained to the (patient/caregiver).  Consent for procedure obtained. Time Out: Verified patient identification, verified procedure, site/side was marked, verified correct patient position, special equipment/implants available, medications/allergies/relevent history reviewed, required imaging and test results available.  Performed  In preparation for procedure, patient was given 100% FiO2 and bronchoscope lubricated. Sedation: Benzodiazepines received 2 mg of versed.  Pt was on fentanyl drip.   Airway entered and the following bronchi were examined: RUL, LUL and Bronchi.   Trache was N. Some purulent secretions.  R mainstem, rest of R lung and L mainstem were patent.  Some purulent secretions seen.  Procedures performed: None.  Bronchoscope removed, Patient placed back on 100% FiO2 at conclusion of procedure.    Evaluation Hemodynamic Status: BP stable throughout; O2 sats: stable throughout Patient's Current Condition: stable Specimens:  None Complications: No apparent complications Patient did tolerate procedure well.   Sean Palmer Angustura 08/02/2016

## 2016-08-02 NOTE — Progress Notes (Signed)
Contacted by micro lab. Pt tracheal aspirate and bronchial lavage being sent to [facility] for sensitivity. MD made aware.

## 2016-08-02 NOTE — Progress Notes (Signed)
Pharmacy Antibiotic Note  Sean Palmer is a 26 y.o. male admitted from [facility] on 07/28/2016 with pneumonia.  Pharmacy has been consulted for Cefepime and Gentamicin dosing. Today is D#5 ABX.   WBC down to 5.3, Afeb. BAL cultures from 4/8 growing pseudomonas. S/p BAL again 4/10. Sensitivities not available yet from microbiology.   Last 10-hr Gent level =1.4 mcg/mL. No change in renal function as measured by SCr and UOP. Will continue gentamicin at q24h dosing based on [place] Nom[facility].  Plan: Continue Gentamicin 450 mg (65m/kg) q24h Continue Cefepime 1gm IV q8h Will f/u micro data, renal function, and pt's clinical condition  Height: _0  (162.6 cm) Weight: 137 lb 9.1 oz (62.4 kg) IBW/kg (Calculated) : 59.2  Temp (24hrs), Avg:97.9 F (36.6 C), Min:96.5 F (35.8 C), Max:98.9 F (37.2 C)   Recent Labs Lab 07/28/16 2314 07/29/16 0221 07/29/16 0240 07/30/16 0414 07/30/16 1004 07/31/16 0315 08/01/16 0418 08/02/16 0334  WBC  --  8.2  --  3.8*  --  4.3 6.0 5.3  CREATININE  --  <0.30*  --  <0.30*  --  <0.30* <0.30* <0.30*  LATICACIDVEN 0.35*  --  0.46*  --   --   --   --   --   GENTRANDOM  --   --   --   --  1.4  --   --   --     CrCl cannot be calculated (This lab value cannot be used to calculate CrCl because it is not a number: <0.30).    Allergies  Allergen Reactions  . Vancomycin Other (See Comments)    redmans syndrome    Antimicrobials this admission: 4/7 Cefepime >>  4/7 Gent >> 4/7 Zyvox >> 4/8  Dose adjustments this admission: 4/8 Gent Level: 1.4 mcg/mL - continue q24h dosing  Microbiology results: 4/6 BCx x2: 1/2 GPC in clusters 4/7 TA: few psuedomonas 4/7 MRSA PCR: neg 4/8 BAL: pseudomonas 4/9 BCx: NMitchell Pharm.D. PGY1 Pharmacy Resident 4/11/20189:44 AM Pager [phone]

## 2016-08-02 NOTE — Progress Notes (Signed)
Cant not get an adequate seal around the trach flanged. Pt is intermittently losing expiratory volumes on mechanical ventilation. Pt is stable at this time, SATs are stable. Noted that trach was change today per MD/RRT. 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 RN aware. No distress or further complications noted at this time.
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
## 2016-08-02 NOTE — Procedures (Signed)
Tracheostomy Change Note  Patient Details:   Name: Sean Palmer DOB: August 14, 1990 MRN: [id number]    Airway Documentation:     Evaluation  O2 sats: stable throughout Complications: No apparent complications Patient did tolerate procedure well. Bilateral Breath Sounds: Clear    Trach changed at this time by MD at bedside.  + Etco2 color change.   Cherylin Mylar 08/02/2016, 12:30 PM

## 2016-08-02 NOTE — Care Management (Signed)
08/02/16 CM spoke with attending Dr Christene Slates - pts condition is chronic and will likely not improve - attending to relay this to father (father is requesting LTACH as discharge instead of SNF however with current information LTACH does not appear to be appropriate)  CM also discussed with physician advisor.  CSW actively pursing SNF near desired area  07/31/16 Pt is not appropriate for LTACH.  Pt is from longterm placement at [facility] - CSW consulted regarding family request of moving pt closer to [facility]

## 2016-08-02 NOTE — Procedures (Signed)
Bedside Bronchoscopy Procedure Note Sean Palmer [id number] [birth year]  Procedure: Bronchoscopy Indications: Diagnostic evaluation of the airways  Procedure Details: #8XLT Shiley Bite block in place: No In preparation for procedure, Patient hyper-oxygenated with 100 % FiO2 Airway entered and the following bronchi were examined: RUL, RML, RLL, LUL, LLL and Bronchi.   Bronchoscope removed.  , Patient placed back on 50% FiO2 at conclusion of procedure.    Evaluation BP 123/69   Pulse 98   Temp (!) 96.5 F (35.8 C) (Axillary)   Resp (!) 22   Ht  (1.626 m)   Wt 137 lb 9.1 oz (62.4 kg)   SpO2 100%   BMI 23.61 kg/m  Breath Sounds:Diminished and Rhonch O2 sats: stable throughout Patient's Current Condition: stable Specimens:  None Complications: No apparent complications Patient did tolerate procedure well.   Cherylin Mylar 08/02/2016, 12:31 PM

## 2016-08-03 DIAGNOSIS — A419 Sepsis, unspecified organism: Secondary | ICD-10-CM | POA: Diagnosis not present

## 2016-08-03 DIAGNOSIS — J9602 Acute respiratory failure with hypercapnia: Secondary | ICD-10-CM | POA: Diagnosis not present

## 2016-08-03 DIAGNOSIS — J9601 Acute respiratory failure with hypoxia: Secondary | ICD-10-CM | POA: Diagnosis not present

## 2016-08-03 DIAGNOSIS — R0603 Acute respiratory distress: Secondary | ICD-10-CM | POA: Diagnosis not present

## 2016-08-03 DIAGNOSIS — J189 Pneumonia, unspecified organism: Secondary | ICD-10-CM | POA: Diagnosis not present

## 2016-08-03 LAB — PHOSPHORUS: Phosphorus: 3.5 mg/dL (ref 2.5–4.6)

## 2016-08-03 LAB — GLUCOSE, CAPILLARY
GLUCOSE-CAPILLARY: 89 mg/dL (ref 65–99)
GLUCOSE-CAPILLARY: 98 mg/dL (ref 65–99)
GLUCOSE-CAPILLARY: 134 mg/dL — AB (ref 65–99)
Glucose-Capillary: 107 mg/dL — ABNORMAL HIGH (ref 65–99)
Glucose-Capillary: 100 mg/dL — ABNORMAL HIGH (ref 65–99)

## 2016-08-03 LAB — MAGNESIUM: Magnesium: 1.9 mg/dL (ref 1.7–2.4)

## 2016-08-03 LAB — CBC
HEMATOCRIT: 30.2 % — AB (ref 39.0–52.0)
Hemoglobin: 9 g/dL — ABNORMAL LOW (ref 13.0–17.0)
MCH: 27.3 pg (ref 26.0–34.0)
MCHC: 29.8 g/dL — AB (ref 30.0–36.0)
MCV: 91.5 fL (ref 78.0–100.0)
Platelets: 164 10*3/uL (ref 150–400)
RBC: 3.3 MIL/uL — ABNORMAL LOW (ref 4.22–5.81)
RDW: 17.5 % — AB (ref 11.5–15.5)
WBC: 8.3 10*3/uL (ref 4.0–10.5)

## 2016-08-03 LAB — BASIC METABOLIC PANEL
Anion gap: 8 (ref 5–15)
BUN: 9 mg/dL (ref 6–20)
CO2: 35 mmol/L — AB (ref 22–32)
CREATININE: 0.32 mg/dL — AB (ref 0.61–1.24)
Calcium: 8.5 mg/dL — ABNORMAL LOW (ref 8.9–10.3)
Chloride: 92 mmol/L — ABNORMAL LOW (ref 101–111)
GFR calc Af Amer: >60 mL/min (ref >60)
GFR calc non Af Amer: >60 mL/min (ref >60)
GLUCOSE: 132 mg/dL — AB (ref 65–99)
Potassium: 5 mmol/L (ref 3.5–5.1)
Sodium: 135 mmol/L (ref 135–145)

## 2016-08-03 LAB — CULTURE, BLOOD (ROUTINE X 2)
Culture: NO GROWTH
SPECIAL REQUESTS: ADEQUATE

## 2016-08-03 MED ORDER — POTASSIUM CHLORIDE 20 MEQ/15ML (10%) PO SOLN
40.0000 meq | Freq: Every day | ORAL | Status: DC
  Administered 2016-08-04 – 2016-08-10 (×5): 40 meq
  Filled 2016-08-03 (×5): qty 30
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
## 2016-08-03 NOTE — Progress Notes (Signed)
Received from 74M via bed with RT and RN.  BP 135/83, RR 24  HR-124 , O2 SATS 92%. Suctioned patient with mod amt of white secretions. Large amt of secretions noted in mouth and suctioned. HOB 37 degrees. Tube feedings at 35cc/hr with placement checked. Large dressing to abdomen noted at Peg Tube site.              Mlissa Tamayo Stopohel RN

## 2016-08-03 NOTE — Progress Notes (Signed)
RT informed RN that PT CVP replacement set up has been prepped and ready for application (at PT bedside).

## 2016-08-03 NOTE — Care Management Note (Signed)
Case Management Note  Patient Details  Name: Sean Palmer MRN: [id number] Date of Birth: [birth year]  Subjective/Objective:                    Action/Plan:   PTA from [name] (approx 1 week prior to that was at [facility] for approx 100 days).  Per CSW - father adamant about discharging to [facility] close to [place].  CM spoke with both attending and physician advisor - pt has a chronic condition and is more appropriate for SNF - however [name] LTACH will accept pt.  Palliative consulted for GOC - CM will follow up with palliative and family in collaboration with CSW attending.    Expected Discharge Date:                  Expected Discharge Plan:  Skilled Nursing Facility (from [facility])  In-House Referral:     Discharge planning Services  CM Consult  Post Acute Care Choice:    Choice offered to:     DME Arranged:    DME Agency:     HH Arranged:    HH Agency:     Status of Service:  In process, will continue to follow  If discussed at Long Length of Stay Meetings, dates discussed:    Additional Comments:  Cherylann Parr, RN 08/03/2016, 11:54 AM

## 2016-08-03 NOTE — Progress Notes (Signed)
Pt transferred from 14M to 4N6 w/o complications noted. Uneventful trip. 100%.Marland KitchenMarland Kitchen

## 2016-08-04 ENCOUNTER — Encounter (HOSPITAL_COMMUNITY): Payer: Self-pay | Admitting: *Deleted

## 2016-08-04 DIAGNOSIS — Z9911 Dependence on respirator [ventilator] status: Secondary | ICD-10-CM | POA: Diagnosis not present

## 2016-08-04 DIAGNOSIS — A419 Sepsis, unspecified organism: Secondary | ICD-10-CM | POA: Diagnosis not present

## 2016-08-04 DIAGNOSIS — R0603 Acute respiratory distress: Secondary | ICD-10-CM | POA: Diagnosis not present

## 2016-08-04 DIAGNOSIS — J9601 Acute respiratory failure with hypoxia: Secondary | ICD-10-CM | POA: Diagnosis not present

## 2016-08-04 LAB — GLUCOSE, CAPILLARY
GLUCOSE-CAPILLARY: 108 mg/dL — AB (ref 65–99)
Glucose-Capillary: 103 mg/dL — ABNORMAL HIGH (ref 65–99)
Glucose-Capillary: 109 mg/dL — ABNORMAL HIGH (ref 65–99)
Glucose-Capillary: 111 mg/dL — ABNORMAL HIGH (ref 65–99)
Glucose-Capillary: 120 mg/dL — ABNORMAL HIGH (ref 65–99)
Glucose-Capillary: 88 mg/dL (ref 65–99)

## 2016-08-04 LAB — CBC
HEMATOCRIT: 30.5 % — AB (ref 39.0–52.0)
Hemoglobin: 9.2 g/dL — ABNORMAL LOW (ref 13.0–17.0)
MCH: 27.4 pg (ref 26.0–34.0)
MCHC: 30.2 g/dL (ref 30.0–36.0)
MCV: 90.8 fL (ref 78.0–100.0)
Platelets: 174 10*3/uL (ref 150–400)
RBC: 3.36 MIL/uL — ABNORMAL LOW (ref 4.22–5.81)
RDW: 17.7 % — AB (ref 11.5–15.5)
WBC: 7.5 10*3/uL (ref 4.0–10.5)

## 2016-08-04 LAB — BASIC METABOLIC PANEL
Anion gap: 8 (ref 5–15)
BUN: 12 mg/dL (ref 6–20)
CALCIUM: 8.4 mg/dL — AB (ref 8.9–10.3)
CO2: 36 mmol/L — ABNORMAL HIGH (ref 22–32)
Chloride: 91 mmol/L — ABNORMAL LOW (ref 101–111)
Creatinine, Ser: <0.3 mg/dL — ABNORMAL LOW (ref 0.61–1.24)
GLUCOSE: 98 mg/dL (ref 65–99)
POTASSIUM: 3.6 mmol/L (ref 3.5–5.1)
Sodium: 135 mmol/L (ref 135–145)

## 2016-08-04 LAB — PHOSPHORUS: PHOSPHORUS: 4.2 mg/dL (ref 2.5–4.6)

## 2016-08-04 LAB — MAGNESIUM: Magnesium: 2 mg/dL (ref 1.7–2.4)
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
## 2016-08-04 NOTE — Progress Notes (Signed)
Chest PT not performed. Equipment unavailable. Will start tonight if equipment received from other hospital.   Sean Palmer

## 2016-08-04 NOTE — Progress Notes (Signed)
See CCM note. Pt's PEG tube pouring out liquid TF.  MD notified and ordered to hold TF at this time.  OK to give meds per MD. Will continue to follow closely and update as needed.

## 2016-08-04 NOTE — Progress Notes (Signed)
eLink Physician-Brief Progress Note Patient Name: Sean Palmer DOB: 05/27/1990 MRN: [id number]   Date of Service  08/04/2016  HPI/Events of Note  Concern again from leakage around PEG / J tube   eICU Interventions  Xray was done already which showed placement wnl of tubes  Will dc TF for now Am doc can assess need for CT in future or GI consult to ensure bulb  Etc patent and avoiding leakage      Intervention Category Minor Interventions: Routine modifications to care plan (e.g. PRN medications for pain, fever)  Nelda Bucks. 08/04/2016, 6:15 PM

## 2016-08-04 NOTE — Consult Note (Signed)
WOC consult requested for air mattress.  Order has been placed in the EMR that he should be placed on an air overlay mattress to decrease pressure after transfering out of ICU.  Called bedside nurse and she will order.  Consult for wounds was previously performed on 4/9; refer to progress notes for assessment; topical treatment orders have been provided for bedside nurses to perform. Please re-consult if further assistance is needed.  Thank-you,  Cammie Mcgee MSN, RN, CWOCN, [place], CNS [phone]

## 2016-08-04 NOTE — Care Management Note (Addendum)
Case Management Note  Patient Details  Name: Sean Palmer MRN: [id number] Date of Birth: 1991/01/14  Subjective/Objective:                    Action/Plan:   PTA from [name] (approx 1 week prior to that was at [facility] for approx 100 days).  Per CSW - father adamant about discharging to [facility] close to [place].  CM spoke with both attending and physician advisor - pt has a chronic condition and is more appropriate for SNF - however [name] will accept pt.  Palliative consulted for GOC - CM will follow up with palliative and family in collaboration with CSW attending.    Expected Discharge Date:                  Expected Discharge Plan:  Skilled Nursing Facility (from [facility])  In-House Referral:     Discharge planning Services  CM Consult  Post Acute Care Choice:    Choice offered to:     DME Arranged:    DME Agency:     HH Arranged:    HH Agency:     Status of Service:  In process, will continue to follow  If discussed at Long Length of Stay Meetings, dates discussed:    Additional Comments: Pt is not yet stable for discharge from [facility]. Dr Phillips Odor, CSW and CM had a lengthy discussion with pts father Sean Palmer via phone this afternoon.  Father expressed his frustrations with past medical care (not at [facility]).  Palliative Care comforted the father and listened to concerns, she ensured father that we will work to get pt to optimal functional level and the appropriate per pts condition facility at discharge.  CM attempted to begin discussion regarding possible discharge options once pt is deemed stable for discharge (including [name] that has agreed to take pt at discharge) -  However father again relayed frustrations with medical care in general and stated his son needed ongoing aggressive care and neither LTACH nor SNF can provide what he needs.  Planning to meet face to face with father, attending, Palliative, CSW and CM 12 noon Monday 4/16 to answer  questions, educate, establish goals of care and continue with appropriate discharge planning discussions.    Dr Phillips Odor with palliative will see pt today Cherylann Parr, RN 08/04/2016, 11:29 AM

## 2016-08-05 ENCOUNTER — Inpatient Hospital Stay (HOSPITAL_COMMUNITY): Payer: Medicare Other

## 2016-08-05 ENCOUNTER — Encounter (HOSPITAL_COMMUNITY): Payer: Self-pay | Admitting: Radiology

## 2016-08-05 DIAGNOSIS — A419 Sepsis, unspecified organism: Secondary | ICD-10-CM | POA: Diagnosis not present

## 2016-08-05 DIAGNOSIS — J9602 Acute respiratory failure with hypercapnia: Secondary | ICD-10-CM | POA: Diagnosis not present

## 2016-08-05 DIAGNOSIS — J9601 Acute respiratory failure with hypoxia: Secondary | ICD-10-CM | POA: Diagnosis not present

## 2016-08-05 DIAGNOSIS — R0603 Acute respiratory distress: Secondary | ICD-10-CM | POA: Diagnosis not present

## 2016-08-05 LAB — BASIC METABOLIC PANEL
ANION GAP: 8 (ref 5–15)
BUN: 13 mg/dL (ref 6–20)
CALCIUM: 8.5 mg/dL — AB (ref 8.9–10.3)
CO2: 35 mmol/L — ABNORMAL HIGH (ref 22–32)
Chloride: 92 mmol/L — ABNORMAL LOW (ref 101–111)
Creatinine, Ser: 0.32 mg/dL — ABNORMAL LOW (ref 0.61–1.24)
Glucose, Bld: 98 mg/dL (ref 65–99)
POTASSIUM: 3.6 mmol/L (ref 3.5–5.1)
Sodium: 135 mmol/L (ref 135–145)

## 2016-08-05 LAB — GLUCOSE, CAPILLARY
GLUCOSE-CAPILLARY: 83 mg/dL (ref 65–99)
Glucose-Capillary: 114 mg/dL — ABNORMAL HIGH (ref 65–99)
Glucose-Capillary: 121 mg/dL — ABNORMAL HIGH (ref 65–99)
Glucose-Capillary: 77 mg/dL (ref 65–99)
Glucose-Capillary: 87 mg/dL (ref 65–99)
Glucose-Capillary: 96 mg/dL (ref 65–99)

## 2016-08-05 LAB — CULTURE, BLOOD (ROUTINE X 2)
CULTURE: NO GROWTH
CULTURE: NO GROWTH
SPECIAL REQUESTS: ADEQUATE
Special Requests: ADEQUATE

## 2016-08-05 LAB — CBC
HCT: 29.4 % — ABNORMAL LOW (ref 39.0–52.0)
HEMOGLOBIN: 9.1 g/dL — AB (ref 13.0–17.0)
MCH: 27.8 pg (ref 26.0–34.0)
MCHC: 31 g/dL (ref 30.0–36.0)
MCV: 89.9 fL (ref 78.0–100.0)
Platelets: 184 10*3/uL (ref 150–400)
RBC: 3.27 MIL/uL — ABNORMAL LOW (ref 4.22–5.81)
RDW: 17.7 % — ABNORMAL HIGH (ref 11.5–15.5)
WBC: 6.7 10*3/uL (ref 4.0–10.5)

## 2016-08-05 LAB — MAGNESIUM: MAGNESIUM: 2.1 mg/dL (ref 1.7–2.4)

## 2016-08-05 LAB — PHOSPHORUS: Phosphorus: 3.4 mg/dL (ref 2.5–4.6)

## 2016-08-05 MED ORDER — SODIUM CHLORIDE 0.9 % IV BOLUS (SEPSIS)
500.0000 mL | Freq: Once | INTRAVENOUS | Status: AC
  Administered 2016-08-05: 500 mL via INTRAVENOUS

## 2016-08-05 MED ORDER — METOPROLOL TARTRATE 5 MG/5ML IV SOLN
INTRAVENOUS | Status: AC
  Administered 2016-08-05: 5 mg via INTRAVENOUS
  Filled 2016-08-05: qty 5

## 2016-08-05 MED ORDER — DILTIAZEM HCL 25 MG/5ML IV SOLN
5.0000 mg | Freq: Four times a day (QID) | INTRAVENOUS | Status: DC | PRN
  Filled 2016-08-05: qty 5

## 2016-08-05 MED ORDER — IPRATROPIUM-ALBUTEROL 0.5-2.5 (3) MG/3ML IN SOLN
3.0000 mL | Freq: Four times a day (QID) | RESPIRATORY_TRACT | Status: DC
  Administered 2016-08-05 – 2016-08-10 (×21): 3 mL via RESPIRATORY_TRACT
  Filled 2016-08-05 (×21): qty 3

## 2016-08-05 MED ORDER — IOPAMIDOL (ISOVUE-300) INJECTION 61%
INTRAVENOUS | Status: AC
  Administered 2016-08-05: 100 mL
  Filled 2016-08-05: qty 100

## 2016-08-05 MED ORDER — DILTIAZEM HCL-DEXTROSE 100-5 MG/100ML-% IV SOLN (PREMIX)
5.0000 mg/h | INTRAVENOUS | Status: DC
  Administered 2016-08-05: 5 mg/h via INTRAVENOUS
  Filled 2016-08-05: qty 100

## 2016-08-05 MED ORDER — DILTIAZEM LOAD VIA INFUSION
10.0000 mg | Freq: Once | INTRAVENOUS | Status: AC
  Administered 2016-08-05: 5 mg via INTRAVENOUS
  Filled 2016-08-05: qty 10

## 2016-08-05 MED ORDER — METOPROLOL TARTRATE 5 MG/5ML IV SOLN
5.0000 mg | INTRAVENOUS | Status: AC
  Administered 2016-08-05: 5 mg via INTRAVENOUS

## 2016-08-05 NOTE — Progress Notes (Signed)
Dr. Izola Price notified of unable to give free water or any meds per tube d/t copious drainage from around G-tube site. She states she will call general surgery for evaluation of G-tube replacement.  Continue to await CT abd/pelvis/chest for further evaluation.

## 2016-08-05 NOTE — Progress Notes (Signed)
Pt transported to CT via ventilator on 100% Fi02. Pt remained stable throughout the trip to & from CT.
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
## 2016-08-05 NOTE — Progress Notes (Signed)
Due to copious amount of drainage around G tube site, unable to give free water or medication via tube.  Md aware.  Will continue to monitor. Karena Addison T

## 2016-08-05 NOTE — Progress Notes (Signed)
Correction to prior note at 12:40 - abdominal/G-tube site drainage is yellow/green/bilious in color, not yellow/orange.  New orders received from Dr. Izola Price for 0.9NS fluid bolus; Cardizem gtt discontinued and PRN IVP orders given if needed; CT abd/pelvis/chest w/IV contrast only - RN does not need to travel w/patient per Dr. Izola Price. Present VS: 91/46 (56); P109.  Continue to monitor closely.

## 2016-08-05 NOTE — Progress Notes (Addendum)
Spoke with Tammy RN at 1235 regarding pt status.  States BP is 70/40 currently with elevated heart rate, unstable at this time.  Notified RN that unable to do PICC until BP stable.  States she will notify MD.  Currently has LUA SL PICC.

## 2016-08-05 NOTE — Progress Notes (Signed)
Notified Tammy, RN that PIV obtained until further notice regarding PICC placement.

## 2016-08-05 NOTE — Progress Notes (Signed)
BP 78/42 (51); HR 112; RR22; sats 95% - Pt remains on Cardizem gtt at /hr.  Paged Dr. Izola Price - holding Cardizem gtt at the moment; anticipate order for 0.9NS fluid bolus.  PICC team unable to exchange single lumen PICC at this time d/t low BP.  If patient needs to remain on Cardizem gtt, will need more than single lumen PICC which is what is now present.  Continue to monitor.  Abdomen continues to drain yellow/orange fluid almost continuously.  Dressing changed again at this time.

## 2016-08-05 NOTE — Progress Notes (Signed)
Stat EKG ordered to confirm rhythm; BP remains soft @ 92/59 (67); P175; $$22; sats 93% on trach/vent FiO2 40%; TV 450; P8

## 2016-08-05 NOTE — Progress Notes (Signed)
Pt became tachy 180-190s after oral care; CCMD and Elink both responding to change in telemetry; pt given Fentanyl per PRN order but no changes in rhythm or rate.  Dr. Izola Price paged - patient given Metoprolol  IVP STAT.  HR slowly decreasing - now in mid 170s.  Will monitor closely.

## 2016-08-06 DIAGNOSIS — R6520 Severe sepsis without septic shock: Secondary | ICD-10-CM | POA: Diagnosis not present

## 2016-08-06 DIAGNOSIS — A419 Sepsis, unspecified organism: Secondary | ICD-10-CM | POA: Diagnosis not present

## 2016-08-06 DIAGNOSIS — R0603 Acute respiratory distress: Secondary | ICD-10-CM | POA: Diagnosis not present

## 2016-08-06 LAB — GLUCOSE, CAPILLARY
GLUCOSE-CAPILLARY: 85 mg/dL (ref 65–99)
GLUCOSE-CAPILLARY: 84 mg/dL (ref 65–99)
GLUCOSE-CAPILLARY: 70 mg/dL (ref 65–99)
GLUCOSE-CAPILLARY: 84 mg/dL (ref 65–99)
Glucose-Capillary: 112 mg/dL — ABNORMAL HIGH (ref 65–99)
Glucose-Capillary: 78 mg/dL (ref 65–99)

## 2016-08-06 LAB — GENTAMICIN LEVEL, RANDOM: Gentamicin Rm: 8.5 ug/mL

## 2016-08-06 MED ORDER — GENTAMICIN SULFATE 40 MG/ML IJ SOLN: 400.0000 mg | INTRAVENOUS | Status: DC

## 2016-08-06 MED ORDER — BARRIER CREAM NON-SPECIFIED
1.0000 "application " | TOPICAL_CREAM | Freq: Three times a day (TID) | TOPICAL | Status: DC | PRN
  Filled 2016-08-06: qty 1

## 2016-08-06 NOTE — Progress Notes (Signed)
Per md will continue to hold medications via G tube and free water flushes.  Karena Addison T

## 2016-08-06 NOTE — Progress Notes (Signed)
Md Notified to view CT results.  Pt stable at this time. Will continue to monitor. Karena Addison T
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
## 2016-08-06 NOTE — Progress Notes (Signed)
Spoke with Tammy Rn regarding PICC placement to provide more lumens.  RN states more lumens id=s no longer needed, pt is stable, PIV working well.  States will cancel order for PICC.
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
## 2016-08-06 NOTE — Progress Notes (Signed)
Pharmacy Antibiotic Note  Sean Palmer is a 26 y.o. male admitted from [place] on 07/28/2016 with pseudomonal PNA.  Pharmacy has been consulted for Cefepime and Gentamicin dosing day 8. Sensitivities pending -WBC= 6.7, Scr= 0.32, UOp ~ 0.6 ml/kg/hr -gent level= 8.5 (gent given at midnight; level at 10: 30am) -weight varies 55-~ 61kg  Plan: Change gentamicin to 415m IV q48hr Continue Cefepime 1gm IV q8h Will f/u micro data, renal function, and pt's clinical condition  Height: _0  (162.6 cm) Weight: 122 lb (55.3 kg) IBW/kg (Calculated) : 59.2  Temp (24hrs), Avg:98.2 F (36.8 C), Min:97.1 F (36.2 C), Max:100.1 F (37.8 C)   Recent Labs Lab 08/01/16 0418 08/02/16 0334 08/03/16 0500 08/03/16 1208 08/04/16 0400 08/05/16 0348 08/06/16 1039  WBC 6.0 5.3 8.3  --  7.5 6.7  --   CREATININE <0.30* <0.30*  --  0.32* <0.30* 0.32*  --   GENTRANDOM  --   --   --   --   --   --  8.5    Estimated Creatinine Clearance: 111.4 mL/min (A) (by C-G formula based on SCr of 0.32 mg/dL (L)).    Allergies  Allergen Reactions  . Vancomycin Other (See Comments)    redmans syndrome    Antimicrobials this admission: 4/7 Cefepime >>  4/7 Gent >> 4/7 Zyvox >> 4/8  Dose adjustments this admission: 4/8 Gent Level: 1.4 mcg/mL - continue q24h dosing 4/15 gent level= 8.5 (gent given at midnight; level at 10: 30am)  Microbiology results: 4/6 BCx x2: 1/2 staph epi 4/7 TA: pseudomonas,few 4/7 MRSA PCR: neg 4/8 BAL: pseudomonas 4/9 BCx: neg   AHildred Laser Pharm D 08/06/2016 12:43 PM
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
## 2016-08-06 NOTE — Consult Note (Signed)
WOC Nurse wound consult note Reason for Consult: Left neck wound, Left ear wound Wound type:Medical equipment related pressure wound, and healing wound L ear Pressure Injury POA: No, Left neck wound appears new, not noted on admission Measurement: Left neck under trach ties, 1.0cm x 1.75cm x 0.1cm pink wound bed, moist, no odor, periwound reddened Left ear has healing pressure wound on lateral ear surface, appears older than from admission but was not noted on admission, no drainage, no odor. Wound bed: see above Drainage (amount, consistency, odor) see above Periwound:see above Dressing procedure/placement/frequency: I have provided nurses with orders for To left neck, cleanse with NS, pat dry, apply foam, change Q5d and prn soiling. Left ear left open to air, when pt on left side should have pillow propped where ear is not receiving pressure. Disccussed with bedside RN.  Pt already has orders to be placed on low air loss mattress once he is moved out to a floor. We will not follow, but will remain available to this patient, to nursing, and the medical and/or surgical teams.  Please re-consult if we need to assist further.    Barnett Hatter, RN-C, WTA-C Wound Treatment Associate

## 2016-08-07 DIAGNOSIS — K9420 Gastrostomy complication, unspecified: Secondary | ICD-10-CM

## 2016-08-07 DIAGNOSIS — A419 Sepsis, unspecified organism: Secondary | ICD-10-CM | POA: Diagnosis not present

## 2016-08-07 DIAGNOSIS — R6520 Severe sepsis without septic shock: Secondary | ICD-10-CM | POA: Diagnosis not present

## 2016-08-07 DIAGNOSIS — J9621 Acute and chronic respiratory failure with hypoxia: Secondary | ICD-10-CM | POA: Diagnosis not present

## 2016-08-07 DIAGNOSIS — J189 Pneumonia, unspecified organism: Secondary | ICD-10-CM | POA: Diagnosis not present

## 2016-08-07 DIAGNOSIS — J9622 Acute and chronic respiratory failure with hypercapnia: Secondary | ICD-10-CM | POA: Diagnosis not present

## 2016-08-07 DIAGNOSIS — Z9911 Dependence on respirator [ventilator] status: Secondary | ICD-10-CM | POA: Diagnosis not present

## 2016-08-07 DIAGNOSIS — R0603 Acute respiratory distress: Secondary | ICD-10-CM | POA: Diagnosis not present

## 2016-08-07 LAB — CULTURE, BAL-QUANTITATIVE W GRAM STAIN: Culture: >=100000 — AB

## 2016-08-07 LAB — BASIC METABOLIC PANEL
Anion gap: 10 (ref 5–15)
BUN: 16 mg/dL (ref 6–20)
CHLORIDE: 99 mmol/L — AB (ref 101–111)
CO2: 31 mmol/L (ref 22–32)
Calcium: 8.3 mg/dL — ABNORMAL LOW (ref 8.9–10.3)
Creatinine, Ser: 0.46 mg/dL — ABNORMAL LOW (ref 0.61–1.24)
GFR calc Af Amer: >60 mL/min (ref >60)
GFR calc non Af Amer: >60 mL/min (ref >60)
GLUCOSE: 87 mg/dL (ref 65–99)
Potassium: 3 mmol/L — ABNORMAL LOW (ref 3.5–5.1)
Sodium: 140 mmol/L (ref 135–145)

## 2016-08-07 LAB — CBC
HCT: 31.7 % — ABNORMAL LOW (ref 39.0–52.0)
HEMOGLOBIN: 9.6 g/dL — AB (ref 13.0–17.0)
MCH: 27.7 pg (ref 26.0–34.0)
MCHC: 30.3 g/dL (ref 30.0–36.0)
MCV: 91.6 fL (ref 78.0–100.0)
Platelets: 230 10*3/uL (ref 150–400)
RBC: 3.46 MIL/uL — AB (ref 4.22–5.81)
RDW: 17.5 % — ABNORMAL HIGH (ref 11.5–15.5)
WBC: 6.5 10*3/uL (ref 4.0–10.5)

## 2016-08-07 LAB — GLUCOSE, CAPILLARY
GLUCOSE-CAPILLARY: 85 mg/dL (ref 65–99)
GLUCOSE-CAPILLARY: 108 mg/dL — AB (ref 65–99)
Glucose-Capillary: 67 mg/dL (ref 65–99)
Glucose-Capillary: 75 mg/dL (ref 65–99)
Glucose-Capillary: 84 mg/dL (ref 65–99)
Glucose-Capillary: 95 mg/dL (ref 65–99)

## 2016-08-07 LAB — SUSCEPTIBILITY RESULT

## 2016-08-07 LAB — CULTURE, BAL-QUANTITATIVE

## 2016-08-07 LAB — CULTURE, RESPIRATORY

## 2016-08-07 LAB — CULTURE, RESPIRATORY W GRAM STAIN

## 2016-08-07 LAB — SUSCEPTIBILITY, AER + ANAEROB

## 2016-08-07 MED ORDER — OXYCODONE HCL 5 MG PO TABS
5.0000 mg | ORAL_TABLET | Freq: Three times a day (TID) | ORAL | Status: DC | PRN
  Administered 2016-08-09 (×2): 5 mg
  Filled 2016-08-07 (×2): qty 1

## 2016-08-07 MED ORDER — SODIUM CHLORIDE 0.9 % IV SOLN: 30.0000 meq | Freq: Once | INTRAVENOUS | Status: DC

## 2016-08-07 MED ORDER — DEXTROSE 50 % IV SOLN
INTRAVENOUS | Status: AC
  Administered 2016-08-07: 25 mL
  Filled 2016-08-07: qty 50

## 2016-08-07 MED ORDER — ACETAMINOPHEN 325 MG PO TABS
650.0000 mg | ORAL_TABLET | Freq: Three times a day (TID) | ORAL | Status: DC
  Administered 2016-08-08 – 2016-08-10 (×7): 650 mg
  Filled 2016-08-07 (×7): qty 2

## 2016-08-07 MED ORDER — SODIUM CHLORIDE 0.9 % IV SOLN
30.0000 meq | Freq: Once | INTRAVENOUS | Status: AC
  Administered 2016-08-07: 30 meq via INTRAVENOUS
  Filled 2016-08-07: qty 15

## 2016-08-07 MED ORDER — ZOLPIDEM TARTRATE 5 MG PO TABS
5.0000 mg | ORAL_TABLET | Freq: Every evening | ORAL | Status: DC | PRN
Start: 2016-08-07 — End: 2016-08-10
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
## 2016-08-07 NOTE — Care Management Note (Signed)
Case Management Note  Patient Details  Name: Sean Palmer MRN: [id number] Date of Birth: [birth year]  Subjective/Objective:                    Action/Plan:   PTA from [name] (approx 1 week prior to that was at [facility] for approx 100 days).  Per CSW - father adamant about discharging to [facility] close to [place].  CM spoke with both attending and physician advisor - pt has a chronic condition and is more appropriate for SNF - however [name] will accept pt.  Palliative consulted for GOC - CM will follow up with palliative and family in collaboration with [facility] attending.    Expected Discharge Date:                  Expected Discharge Plan:  [place] (from [facility])  In-House Referral:     Discharge planning Services  CM Consult  Post Acute Care Choice:    Choice offered to:     DME Arranged:    DME Agency:     HH Arranged:    HH Agency:     Status of Service:  In process, will continue to follow  If discussed at Long Length of Stay Meetings, dates discussed:    Additional Comments: 08/07/2016 CM, CSW and palliative care doctor met face to face with father and family friend.  Meeting was very friendly and all questions were answere/concerns voiced.  Palliative doctor provided realistic prognosis for pt and explained that per assessments and clinical expertise pt will not likely recover to fathers expectations.  CM and Palliative spoke with with father about next steps including optimizing health and transferring to another facility once pt is deemed medically stable including [name] or out of state SNF facility per physician advisor.  Dr Hilma Favors to follow up with pulmonary, GI and CEO at [facility] hospital to ensure a detail plan is established prior to discharge.   - [name] liaison continue to offer bed in [place] spoke directly with father towards end of meeting - father is in agreement to discharge back to [name].  08/04/16  Pt is  not yet stable for discharge from [facility]. Dr Hilma Favors, CSW and CM had a lengthy discussion with pts father Sean Palmer via phone this afternoon.  Father expressed his frustrations with past medical care (not at [facility]).  Palliative Care comforted the father and listened to concerns, she ensured father that we will work to get pt to optimal functional level and the appropriate per pts condition facility at discharge.  CM attempted to begin discussion regarding possible discharge options once pt is deemed stable for discharge (including [name] that has agreed to take pt at discharge) -  However father again relayed frustrations with medical care in general and stated his son needed ongoing aggressive care and neither LTACH nor SNF can provide what he needs.  Planning to meet face to face with father, attending, Palliative, CSW and CM 12 noon Monday 4/16 to answer questions, educate, establish goals of care and continue with appropriate discharge planning discussions.    Dr Hilma Favors with palliative will see pt today Maryclare Labrador, RN 08/07/2016, 2:56 PM
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
## 2016-08-07 NOTE — Progress Notes (Signed)
Pt has been clear and received one out of three CPT. RT didn't get any secretion from the Pt. Pt will be going to [facility] on 08/07/16.
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
## 2016-08-08 ENCOUNTER — Encounter (HOSPITAL_COMMUNITY): Payer: Self-pay | Admitting: Radiology

## 2016-08-08 ENCOUNTER — Inpatient Hospital Stay (HOSPITAL_COMMUNITY): Payer: Medicare Other

## 2016-08-08 DIAGNOSIS — J9601 Acute respiratory failure with hypoxia: Secondary | ICD-10-CM | POA: Diagnosis not present

## 2016-08-08 DIAGNOSIS — A419 Sepsis, unspecified organism: Secondary | ICD-10-CM | POA: Diagnosis not present

## 2016-08-08 DIAGNOSIS — J9602 Acute respiratory failure with hypercapnia: Secondary | ICD-10-CM | POA: Diagnosis not present

## 2016-08-08 DIAGNOSIS — R0603 Acute respiratory distress: Secondary | ICD-10-CM | POA: Diagnosis not present

## 2016-08-08 HISTORY — PX: IR FLUORO RM 30-60 MIN: IMG2384

## 2016-08-08 LAB — CBC
HCT: 32.4 % — ABNORMAL LOW (ref 39.0–52.0)
HEMOGLOBIN: 9.6 g/dL — AB (ref 13.0–17.0)
MCH: 27.1 pg (ref 26.0–34.0)
MCHC: 29.6 g/dL — AB (ref 30.0–36.0)
MCV: 91.5 fL (ref 78.0–100.0)
Platelets: 241 10*3/uL (ref 150–400)
RBC: 3.54 MIL/uL — AB (ref 4.22–5.81)
RDW: 16.8 % — ABNORMAL HIGH (ref 11.5–15.5)
WBC: 5.6 10*3/uL (ref 4.0–10.5)

## 2016-08-08 LAB — GLUCOSE, CAPILLARY
GLUCOSE-CAPILLARY: 71 mg/dL (ref 65–99)
GLUCOSE-CAPILLARY: 101 mg/dL — AB (ref 65–99)
Glucose-Capillary: 72 mg/dL (ref 65–99)
Glucose-Capillary: 94 mg/dL (ref 65–99)
Glucose-Capillary: 87 mg/dL (ref 65–99)

## 2016-08-08 LAB — BASIC METABOLIC PANEL
ANION GAP: 11 (ref 5–15)
BUN: 16 mg/dL (ref 6–20)
CHLORIDE: 102 mmol/L (ref 101–111)
CO2: 30 mmol/L (ref 22–32)
CREATININE: 0.48 mg/dL — AB (ref 0.61–1.24)
Calcium: 8.6 mg/dL — ABNORMAL LOW (ref 8.9–10.3)
GFR calc non Af Amer: >60 mL/min (ref >60)
Glucose, Bld: 73 mg/dL (ref 65–99)
POTASSIUM: 3.1 mmol/L — AB (ref 3.5–5.1)
SODIUM: 143 mmol/L (ref 135–145)

## 2016-08-08 MED ORDER — IOPAMIDOL (ISOVUE-300) INJECTION 61%
INTRAVENOUS | Status: AC
  Filled 2016-08-08: qty 50

## 2016-08-08 MED ORDER — POTASSIUM CHLORIDE CRYS ER 20 MEQ PO TBCR: 40.0000 meq | EXTENDED_RELEASE_TABLET | Freq: Once | ORAL | Status: DC

## 2016-08-08 MED ORDER — IOPAMIDOL (ISOVUE-300) INJECTION 61%
INTRAVENOUS | Status: AC
  Administered 2016-08-08: 75 mL
  Filled 2016-08-08: qty 75

## 2016-08-08 NOTE — Progress Notes (Signed)
Per IR MD, okay from his standards to resume meds per tube and TF.  Nutrition consult placed. Primary MD notified.

## 2016-08-08 NOTE — Progress Notes (Signed)
CPT done at 0900
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
## 2016-08-09 ENCOUNTER — Inpatient Hospital Stay (HOSPITAL_COMMUNITY): Payer: Medicare Other

## 2016-08-09 DIAGNOSIS — Z9911 Dependence on respirator [ventilator] status: Secondary | ICD-10-CM | POA: Diagnosis not present

## 2016-08-09 DIAGNOSIS — R0603 Acute respiratory distress: Secondary | ICD-10-CM | POA: Diagnosis not present

## 2016-08-09 DIAGNOSIS — J189 Pneumonia, unspecified organism: Secondary | ICD-10-CM | POA: Diagnosis not present

## 2016-08-09 DIAGNOSIS — A419 Sepsis, unspecified organism: Secondary | ICD-10-CM | POA: Diagnosis not present

## 2016-08-09 DIAGNOSIS — J9622 Acute and chronic respiratory failure with hypercapnia: Secondary | ICD-10-CM | POA: Diagnosis not present

## 2016-08-09 DIAGNOSIS — Z931 Gastrostomy status: Secondary | ICD-10-CM

## 2016-08-09 DIAGNOSIS — K9423 Gastrostomy malfunction: Secondary | ICD-10-CM

## 2016-08-09 DIAGNOSIS — K9420 Gastrostomy complication, unspecified: Secondary | ICD-10-CM | POA: Diagnosis not present

## 2016-08-09 DIAGNOSIS — J9621 Acute and chronic respiratory failure with hypoxia: Secondary | ICD-10-CM | POA: Diagnosis not present

## 2016-08-09 LAB — CBC
HEMATOCRIT: 32.1 % — AB (ref 39.0–52.0)
Hemoglobin: 9.6 g/dL — ABNORMAL LOW (ref 13.0–17.0)
MCH: 27.6 pg (ref 26.0–34.0)
MCHC: 29.9 g/dL — AB (ref 30.0–36.0)
MCV: 92.2 fL (ref 78.0–100.0)
PLATELETS: 265 10*3/uL (ref 150–400)
RBC: 3.48 MIL/uL — ABNORMAL LOW (ref 4.22–5.81)
RDW: 17 % — AB (ref 11.5–15.5)
WBC: 7.1 10*3/uL (ref 4.0–10.5)

## 2016-08-09 LAB — BASIC METABOLIC PANEL
Anion gap: 11 (ref 5–15)
BUN: 14 mg/dL (ref 6–20)
CALCIUM: 8.4 mg/dL — AB (ref 8.9–10.3)
CO2: 30 mmol/L (ref 22–32)
Chloride: 103 mmol/L (ref 101–111)
Creatinine, Ser: 0.51 mg/dL — ABNORMAL LOW (ref 0.61–1.24)
GFR calc non Af Amer: >60 mL/min (ref >60)
GLUCOSE: 84 mg/dL (ref 65–99)
Potassium: 3.2 mmol/L — ABNORMAL LOW (ref 3.5–5.1)
Sodium: 144 mmol/L (ref 135–145)

## 2016-08-09 LAB — GLUCOSE, CAPILLARY
GLUCOSE-CAPILLARY: 66 mg/dL (ref 65–99)
GLUCOSE-CAPILLARY: 110 mg/dL — AB (ref 65–99)
GLUCOSE-CAPILLARY: 115 mg/dL — AB (ref 65–99)
GLUCOSE-CAPILLARY: 117 mg/dL — AB (ref 65–99)
GLUCOSE-CAPILLARY: 96 mg/dL (ref 65–99)
Glucose-Capillary: 69 mg/dL (ref 65–99)
Glucose-Capillary: 76 mg/dL (ref 65–99)
Glucose-Capillary: 77 mg/dL (ref 65–99)
Glucose-Capillary: 81 mg/dL (ref 65–99)

## 2016-08-09 LAB — MAGNESIUM: Magnesium: 2 mg/dL (ref 1.7–2.4)

## 2016-08-09 MED ORDER — OFLOXACIN 0.3 % OP SOLN
10.0000 [drp] | Freq: Every day | OPHTHALMIC | Status: DC
  Administered 2016-08-09 – 2016-08-10 (×2): 10 [drp] via OTIC
  Filled 2016-08-09: qty 5

## 2016-08-09 MED ORDER — DEXTROSE 50 % IV SOLN
INTRAVENOUS | Status: AC
  Administered 2016-08-09: 25 mL
  Filled 2016-08-09: qty 50

## 2016-08-09 MED ORDER — POTASSIUM CHLORIDE 20 MEQ/15ML (10%) PO SOLN
40.0000 meq | Freq: Once | ORAL | Status: AC
  Administered 2016-08-09: 40 meq
  Filled 2016-08-09: qty 30

## 2016-08-09 MED ORDER — DEXTROSE 50 % IV SOLN
25.0000 mL | Freq: Once | INTRAVENOUS | Status: AC
  Administered 2016-08-09: 25 mL via INTRAVENOUS

## 2016-08-09 MED ORDER — POTASSIUM CHLORIDE 20 MEQ PO PACK: 40.0000 meq | PACK | Freq: Once | ORAL | Status: DC

## 2016-08-09 MED ORDER — ARTIFICIAL TEARS OP OINT
TOPICAL_OINTMENT | Freq: Three times a day (TID) | OPHTHALMIC | Status: DC
  Administered 2016-08-09 – 2016-08-10 (×4): via OPHTHALMIC
  Filled 2016-08-09 (×2): qty 3.5

## 2016-08-09 NOTE — Progress Notes (Signed)
Disposition plan is for [facility] when medically stable. [name] has agreed to accept him as a patient. CSW signing off. CSW continues to remain available as needed.    Enos Fling, MSW, LCSW [facility] ED/38M Clinical Social Worker [phone]
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
## 2016-08-09 NOTE — Care Management Note (Addendum)
Case Management Note  Patient Details  Name: GERTRUDE TARBET MRN: [id number] Date of Birth: 03-03-1991  Subjective/Objective:                    Action/Plan:   PTA from [name] (approx 1 week prior to that was at [facility] for approx 100 days).  Per CSW - father adamant about discharging to [facility] close to [place].  CM spoke with both attending and physician advisor - pt has a chronic condition and is more appropriate for SNF - however [name] will accept pt.  Palliative consulted for GOC - CM will follow up with palliative and family in collaboration with [place] attending.    Expected Discharge Date:                  Expected Discharge Plan:  [place] (from [facility])  In-House Referral:     Discharge planning Services  CM Consult  Post Acute Care Choice:    Choice offered to:     DME Arranged:    DME Agency:     HH Arranged:    HH Agency:     Status of Service:  In process, will continue to follow  If discussed at Long Length of Stay Meetings, dates discussed:    Additional Comments: 08/09/2016  1:31:  Dr Hilma Favors informed CM that she contacted bedside nurse, explained plan and confusion has been cleared regarding feeding to ensure continuity of appropriate care.   Discharge plan tentative for tomorrow to [facility].  Palliative to follow up with father regarding plan status, head CT results and tentative discharge to [facility] tomorrow.  CM confirmed with [name] that bed will be available tomorrow.  CM will continue to follow for discharge needs  11:51: CM was contacted by bedside nurse via phone stating she was not going to begin tube feeds at this time.  CM again requested that bedside nurse follow up with both attending and Palliative to gain clarity and actual orders.  CM also requested that Palliative follow up with attending and bedside nurse.  10 oclock hour;   CM went to bedside and again explained to bedside nurse that a  collaborative effort was in the works including [place] and requested that bedside nurse follow up directly with attending and Palliative regarding the appropriate feeding plan - as specifics would have to be given by MD.  CM also relayed to bedside nurse that specific parameters had been agreed to by father and Palliative to make sure pt was optimized for success at discharge - this including the feeding plan   9:20 CM spoke with bedside nurse via phone requesting clarity with tube feeding order (current order states continuous which is in contraindication of current medical plan of pt )- nurse informed CM that she believed pt was to be on continuous feeds rather than trickle feeds even though this was stated in notes and would proceed with continous.  CM text paged  both attending and Palliative.  Pt has PEG tube procedure in IR yesterday and no drainage seen as of yet  08/07/16 CM, CSW and palliative care doctor met face to face with father and family friend.  Meeting was very friendly and all questions were answere/concerns voiced.  Palliative doctor provided realistic prognosis for pt and explained that per assessments and clinical expertise pt will not likely recover to fathers expectations.  CM and Palliative spoke with with father about next steps including optimizing  health and transferring to another facility once pt is deemed medically stable including [name] or out of state SNF facility per physician advisor.  Dr Hilma Favors to follow up with pulmonary, GI and CEO at [facility] hospital to ensure a detail plan is established prior to discharge.   - [name] liaison continue to offer bed in [place] spoke directly with father towards end of meeting - father is in agreement to discharge back to [name].  08/04/16  Pt is not yet stable for discharge from [facility]. Dr Hilma Favors, CSW and CM had a lengthy discussion with pts father [name] via phone this afternoon.  Father expressed his  frustrations with past medical care (not at [facility]).  Palliative Care comforted the father and listened to concerns, she ensured father that we will work to get pt to optimal functional level and the appropriate per pts condition facility at discharge.  CM attempted to begin discussion regarding possible discharge options once pt is deemed stable for discharge (including [name] that has agreed to take pt at discharge) -  However father again relayed frustrations with medical care in general and stated his son needed ongoing aggressive care and neither LTACH nor SNF can provide what he needs.  Planning to meet face to face with father, attending, Palliative, CSW and CM 12 noon Monday 4/16 to answer questions, educate, establish goals of care and continue with appropriate discharge planning discussions.    Dr Hilma Favors with palliative will see pt today Maryclare Labrador, RN 08/09/2016, 9:18 AM

## 2016-08-09 NOTE — Significant Event (Addendum)
Flexiseal came out during a turn. Reinserted without complications. However, it continues to come out despite adjustments to the balloon inflation. As of current, patient not able to hold in a flexiseal. Existing flexiseal discarded. Sacral wound dressings changed. Unable to place a rectal pouch due to proximity of wounds. Will give patient a break from flexiseal, to allow patient to regain rectal tone.     Sean Palmer

## 2016-08-09 NOTE — Consult Note (Addendum)
WOC follow-up:  WOC consult requested to discuss possible Vac therapy. Refer to 4/9 consult for previous progress notes and wound assessment. Family member was asking if Vac dressing would promote healing to wounds and had a meeting with palliative care to discuss goals of care earlier today.  Pt is currently getting Aquacel to sacrum and right ischium wounds to absorb drainage and promote healing.  Wounds are not significantly deep; approx 1 cm to each location, so Vac therapy could be used but it would be difficult to maintain a seal if patient is incontinent, related to the location of his wounds.  His protein level should be optimized to promote wound healing and this has been problematic since feeding tube was previously leaking. Feeding tube leakage is reported to be resolved at this time by the bedside nurse, discontinued order for Aquacel to G-tube site which was ordered when area was previously red and macerated.  Pt is on an air mattress to decrease pressure.  No family members at the bedside to discuss plan of care this afternoon.  Palliative care team present and Dr Phillips Odor states she will discuss options with the family during their next meeting. Please re-consult if Vac application is desired or further assistance is needed.   Thank-you,  Cammie Mcgee MSN, RN, CWOCN, [place], CNS [phone]
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
## 2016-08-09 NOTE — Progress Notes (Signed)
Hypoglycemic Event  CBG: 66  Treatment: 25ml D50  Symptoms: none. Pt has TBI and is unable to communicate   Follow-up CBG: Time: 0205 CBG Result: 110  Possible Reasons for Event: pt is NPO and tube feeds have been stopped due to PEG tube issues
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
## 2016-08-09 NOTE — Significant Event (Signed)
RN spoke with IR Dr. Bonnielee Haff regarding if patient can be started on tube feeds, after discussing with attending Dr. Elvera Lennox this morning during rounds who gave RN verbal orders to follow up with IR for tube feeds initiation. No leakage around G/J tube noted.   Per Dr. Bonnielee Haff, RN can start trickle feeds. RN about to start trickle feeds at 1045am, when case management Samantha stopped RN and said that patient should be on bolus feeds and that this was the plan discussed with palliative and patient's father at recent family meeting and plan of goals set up. CM not able to specific how much if bolus feeds, but that it needed to be bolus, trickle feeds.   Advised CM that I am receiving conflicting information at this time based on what she is telling me and that if she is telling me these, that she would need to speak with IR MD who had given me a different order, which is to start trickle feeds. CM speaking with Palliative MD as she is discussing this with me. Per CM, Palliative MD to let RN know about the tube feeds.   1138-DrElvera Lennox called RN back and told RN to start trickle feeds. RN told Dr. Elvera Lennox about the conversations with CM that just happened recently in the last hour. MD to order dietitian consult. Will not start tube feeds until a confirmative order is in [facility] after dietitian sees patient.       Montravious Weigelt

## 2016-08-09 NOTE — Progress Notes (Addendum)
Palliative Care Progress Note  Followed up with Sean Palmer today to help facilitate goals of care established with his father on 4/16.  1. G-Tube Complications  IR has evaluated and replaced the tube  Start trickle feeds for now-short term to make sure there is no leaking or residuals-will obtain nutrition consult and request recommendations for timed bolus feeding to be initiated here and continued on discharge.  If ok over night with trickle can start bolus feeding tomorrow  2. Dental Issues-CT Maxillofacial, does show impacted wisdom teeth, but no acute infection or soft tissue swelling, he does have a completely opacified right ear canal full of cerumen, sinuses are fluid filled-limited brain imaging shows severe atrophy.  3. Respiratory: Patient has been unable to wean without distress, will need to complete full course of abx for PNA, needs Chest PT/Vest q4 while awake.  4. Pain is better controlled, spacticity persists.  Disposition plan is for [facility] when medically stable. [name] has agreed to accept him as a patient. I will contact his father to update him on the goals for this hospitalization. I will also share with him the severe brain atrophy seen on CT.  Anderson Malta, DO Palliative Medicine [phone]  Time:25 minutes Greater than 50%  of this time was spent counseling and coordinating care related to the above assessment and plan.  CT showed cerumen impaction of right ear. Using gentle suction and irrigation- a very large amount of cerumen and debris was removed from his right ear canal. Will add on ear gtt, external otitis infection prophylaxis.

## 2016-08-10 DIAGNOSIS — K9420 Gastrostomy complication, unspecified: Secondary | ICD-10-CM | POA: Diagnosis not present

## 2016-08-10 DIAGNOSIS — Z931 Gastrostomy status: Secondary | ICD-10-CM

## 2016-08-10 DIAGNOSIS — J9622 Acute and chronic respiratory failure with hypercapnia: Secondary | ICD-10-CM | POA: Diagnosis not present

## 2016-08-10 DIAGNOSIS — S069XAA Unspecified intracranial injury with loss of consciousness status unknown, initial encounter: Secondary | ICD-10-CM | POA: Diagnosis not present

## 2016-08-10 DIAGNOSIS — A419 Sepsis, unspecified organism: Secondary | ICD-10-CM | POA: Diagnosis not present

## 2016-08-10 DIAGNOSIS — R0603 Acute respiratory distress: Secondary | ICD-10-CM | POA: Diagnosis not present

## 2016-08-10 DIAGNOSIS — R403 Persistent vegetative state: Secondary | ICD-10-CM | POA: Diagnosis present

## 2016-08-10 DIAGNOSIS — Z515 Encounter for palliative care: Secondary | ICD-10-CM

## 2016-08-10 DIAGNOSIS — A310 Pulmonary mycobacterial infection: Secondary | ICD-10-CM

## 2016-08-10 DIAGNOSIS — H04123 Dry eye syndrome of bilateral lacrimal glands: Secondary | ICD-10-CM | POA: Diagnosis present

## 2016-08-10 DIAGNOSIS — Z9911 Dependence on respirator [ventilator] status: Secondary | ICD-10-CM

## 2016-08-10 DIAGNOSIS — Z930 Tracheostomy status: Secondary | ICD-10-CM

## 2016-08-10 DIAGNOSIS — H6120 Impacted cerumen, unspecified ear: Secondary | ICD-10-CM | POA: Diagnosis present

## 2016-08-10 DIAGNOSIS — J9621 Acute and chronic respiratory failure with hypoxia: Secondary | ICD-10-CM | POA: Diagnosis not present

## 2016-08-10 DIAGNOSIS — S069X9A Unspecified intracranial injury with loss of consciousness of unspecified duration, initial encounter: Secondary | ICD-10-CM | POA: Diagnosis not present

## 2016-08-10 LAB — BASIC METABOLIC PANEL
ANION GAP: 11 (ref 5–15)
BUN: 11 mg/dL (ref 6–20)
CALCIUM: 8.5 mg/dL — AB (ref 8.9–10.3)
CO2: 31 mmol/L (ref 22–32)
Chloride: 102 mmol/L (ref 101–111)
Creatinine, Ser: 0.34 mg/dL — ABNORMAL LOW (ref 0.61–1.24)
Glucose, Bld: 80 mg/dL (ref 65–99)
Potassium: 3.5 mmol/L (ref 3.5–5.1)
SODIUM: 144 mmol/L (ref 135–145)

## 2016-08-10 LAB — GLUCOSE, CAPILLARY
GLUCOSE-CAPILLARY: 80 mg/dL (ref 65–99)
GLUCOSE-CAPILLARY: 94 mg/dL (ref 65–99)
GLUCOSE-CAPILLARY: 97 mg/dL (ref 65–99)
GLUCOSE-CAPILLARY: 119 mg/dL — AB (ref 65–99)

## 2016-08-10 LAB — CBC
HEMATOCRIT: 33.2 % — AB (ref 39.0–52.0)
Hemoglobin: 9.5 g/dL — ABNORMAL LOW (ref 13.0–17.0)
MCH: 26.6 pg (ref 26.0–34.0)
MCHC: 28.6 g/dL — AB (ref 30.0–36.0)
MCV: 93 fL (ref 78.0–100.0)
Platelets: 257 10*3/uL (ref 150–400)
RBC: 3.57 MIL/uL — ABNORMAL LOW (ref 4.22–5.81)
RDW: 17.1 % — AB (ref 11.5–15.5)
WBC: 6.3 10*3/uL (ref 4.0–10.5)

## 2016-08-10 MED ORDER — AZITHROMYCIN 200 MG/5ML PO SUSR
250.0000 mg | Freq: Every day | ORAL | Status: DC
  Administered 2016-08-10: 250 mg via ORAL
  Filled 2016-08-10: qty 10

## 2016-08-10 MED ORDER — VITAL AF 1.2 CAL PO LIQD
1000.0000 mL | Freq: Four times a day (QID) | ORAL | Status: DC
Start: 2016-08-10 — End: 2016-08-10
  Administered 2016-08-10: 1000 mL

## 2016-08-10 MED ORDER — ARTIFICIAL TEARS OP OINT: TOPICAL_OINTMENT | Freq: Three times a day (TID) | OPHTHALMIC | 0 refills | Status: DC

## 2016-08-10 MED ORDER — AZITHROMYCIN 200 MG/5ML PO SUSR: 250.0000 mg | Freq: Every day | ORAL | 0 refills | Status: DC

## 2016-08-10 MED ORDER — LORATADINE 10 MG PO TABS
10.0000 mg | ORAL_TABLET | Freq: Every day | ORAL | Status: DC
  Administered 2016-08-10: 10 mg via ORAL
  Filled 2016-08-10: qty 1

## 2016-08-10 NOTE — Progress Notes (Signed)
Pt sounding like he needs suction. RN performed oral and tracheal suction with minimal thin white/clear secretions.  Trach care preformed and Inner cannula changed but the pt still sounded like there was secretions present. RT notified and administered saline to break up secretions. O2 sats remained high while suctioning was performed.  Pt sounds better. Will continue to monitor.

## 2016-08-10 NOTE — Progress Notes (Signed)
Report given to [facility] staff, Melony Overly. Father called, updated on transfer and room number. Patient stable for transport. Transport with vent, telemetry o2
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
## 2016-08-10 NOTE — Plan of Care (Signed)
Problem: Health Behavior/Discharge Planning: Goal: Ability to manage health-related needs will improve Outcome: Adequate for Discharge Patient unable to communicate or care for himself, being discharged to [facility]  Problem: Physical Regulation: Goal: Ability to maintain clinical measurements within normal limits will improve Outcome: Adequate for Discharge Vitals within normal limits  Goal: Will remain free from infection Outcome: Adequate for Discharge On oral antibiotics   Problem: Skin Integrity: Goal: Risk for impaired skin integrity will decrease Outcome: Adequate for Discharge Continues with decubitus  Problem: Physical Regulation: Goal: Ability to maintain a body temperature in the normal range will improve Outcome: Adequate for Discharge Receiving tylenol around the clock  Problem: Respiratory: Goal: Respiratory status will improve Outcome: Adequate for Discharge Remains on vent  Comments: Patient to be transferred with LTAC

## 2016-08-10 NOTE — Progress Notes (Signed)
Report given to [name] RN, Toya Smothers
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
## 2016-08-10 NOTE — Care Management Note (Signed)
Case Management Note  Patient Details  Name: Sean Palmer MRN: [id number] Date of Birth: [birth year]  Subjective/Objective:                    Action/Plan:   PTA from [name] (approx 1 week prior to that was at [facility] for approx 100 days).  Per CSW - father adamant about discharging to [facility] close to [place].  CM spoke with both attending and physician advisor - pt has a chronic condition and is more appropriate for SNF - however [name] will accept pt.  Palliative consulted for GOC - CM will follow up with palliative and family in collaboration with [place] attending.    Expected Discharge Date:  08/10/16               Expected Discharge Plan:  [place] (from [facility]  In-House Referral:     Discharge planning Services  CM Consult  Post Acute Care Choice:    Choice offered to:     DME Arranged:    DME Agency:     HH Arranged:    [place] Agency:     Status of Service:  In process, will continue to follow  If discussed at Long Length of Stay Meetings, dates discussed:    Additional Comments: 08/10/2016  Pt has been medically cleared by both attending and Palliative to discharge to [facility] today.  Palliative spoke with father today  and father is aligned to discharge to [facility] today.  CM contacted [facility] and arranged transport at approximately 4:30 pm today.  Palliative has talked with [facility] to relay fathers expectations and gain alignment with care plan.  08/09/16 1:31:  Dr Hilma Favors informed CM that she contacted bedside nurse, explained plan and confusion has been cleared regarding feeding to ensure continuity of appropriate care.   Discharge plan tentative for tomorrow to [facility].  Palliative to follow up with father regarding plan status, head CT results and tentative discharge to [facility] tomorrow.  CM confirmed with [name] Liason that bed will be available tomorrow.  CM will continue to follow for discharge needs  11:51: CM  was contacted by bedside nurse via phone stating she was not going to begin tube feeds at this time.  CM again requested that bedside nurse follow up with both attending and Palliative to gain clarity and actual orders.  CM also requested that Palliative follow up with attending and bedside nurse.  10 oclock hour;   CM went to bedside and again explained to bedside nurse that a collaborative effort was in the works including [facility] and requested that bedside nurse follow up directly with attending and Palliative regarding the appropriate feeding plan - as specifics would have to be given by MD.  CM also relayed to bedside nurse that specific parameters had been agreed to by father and Palliative to make sure pt was optimized for success at discharge - this including the feeding plan   9:20 CM spoke with bedside nurse via phone requesting clarity with tube feeding order (current order states continuous which is in contraindication of current medical plan of pt )- nurse informed CM that she believed pt was to be on continuous feeds rather than trickle feeds even though this was stated in notes and would proceed with continous.  CM text paged  both attending and Palliative.  Pt has PEG tube procedure in IR yesterday and no drainage seen as of yet  08/07/16  CM, CSW and palliative care doctor met face to face with father and family friend.  Meeting was very friendly and all questions were answere/concerns voiced.  Palliative doctor provided realistic prognosis for pt and explained that per assessments and clinical expertise pt will not likely recover to fathers expectations.  CM and Palliative spoke with with father about next steps including optimizing health and transferring to another facility once pt is deemed medically stable including [name] or out of state SNF facility per physician advisor.  Dr Hilma Favors to follow up with pulmonary, GI and CEO at [facility] to ensure a detail plan is  established prior to discharge.   - [name] liaison continue to offer bed in [place] spoke directly with father towards end of meeting - father is in agreement to discharge back to [name].  08/04/16  Pt is not yet stable for discharge from [facility]. Dr Hilma Favors, CSW and CM had a lengthy discussion with pts father Sean Palmer via phone this afternoon.  Father expressed his frustrations with past medical care (not at [facility]).  Palliative Care comforted the father and listened to concerns, she ensured father that we will work to get pt to optimal functional level and the appropriate per pts condition facility at discharge.  CM attempted to begin discussion regarding possible discharge options once pt is deemed stable for discharge (including [name] that has agreed to take pt at discharge) -  However father again relayed frustrations with medical care in general and stated his son needed ongoing aggressive care and neither LTACH nor SNF can provide what he needs.  Planning to meet face to face with father, attending, Palliative, CSW and CM 12 noon Monday 4/16 to answer questions, educate, establish goals of care and continue with appropriate discharge planning discussions.    Dr Hilma Favors with palliative will see pt today Maryclare Labrador, RN 08/10/2016, 2:30 PM

## 2016-08-14 ENCOUNTER — Encounter (HOSPITAL_COMMUNITY): Payer: Self-pay | Admitting: *Deleted

## 2016-09-19 ENCOUNTER — Inpatient Hospital Stay (HOSPITAL_COMMUNITY): Payer: Medicare Other

## 2016-09-19 ENCOUNTER — Encounter (HOSPITAL_COMMUNITY): Payer: Self-pay | Admitting: Emergency Medicine

## 2016-09-19 ENCOUNTER — Inpatient Hospital Stay (HOSPITAL_COMMUNITY)
Admission: EM | Admit: 2016-09-19 | Discharge: 2016-09-28 | DRG: 853 | Disposition: A | Discharge status: Other Acute Inpatient Hospital | Payer: Medicare Other | Attending: Pulmonary Disease | Admitting: Pulmonary Disease

## 2016-09-19 ENCOUNTER — Emergency Department (HOSPITAL_COMMUNITY): Payer: Medicare Other

## 2016-09-19 DIAGNOSIS — M4628 Osteomyelitis of vertebra, sacral and sacrococcygeal region: Secondary | ICD-10-CM | POA: Diagnosis present

## 2016-09-19 DIAGNOSIS — Y833 Surgical operation with formation of external stoma as the cause of abnormal reaction of the patient, or of later complication, without mention of misadventure at the time of the procedure: Secondary | ICD-10-CM | POA: Diagnosis present

## 2016-09-19 DIAGNOSIS — R64 Cachexia: Secondary | ICD-10-CM | POA: Diagnosis present

## 2016-09-19 DIAGNOSIS — Z681 Body mass index (BMI) 19 or less, adult: Secondary | ICD-10-CM | POA: Diagnosis not present

## 2016-09-19 DIAGNOSIS — R739 Hyperglycemia, unspecified: Secondary | ICD-10-CM | POA: Diagnosis not present

## 2016-09-19 DIAGNOSIS — D638 Anemia in other chronic diseases classified elsewhere: Secondary | ICD-10-CM | POA: Diagnosis present

## 2016-09-19 DIAGNOSIS — R403 Persistent vegetative state: Secondary | ICD-10-CM | POA: Diagnosis present

## 2016-09-19 DIAGNOSIS — J189 Pneumonia, unspecified organism: Secondary | ICD-10-CM | POA: Diagnosis not present

## 2016-09-19 DIAGNOSIS — Z930 Tracheostomy status: Secondary | ICD-10-CM | POA: Diagnosis not present

## 2016-09-19 DIAGNOSIS — E2740 Unspecified adrenocortical insufficiency: Secondary | ICD-10-CM | POA: Diagnosis present

## 2016-09-19 DIAGNOSIS — Z931 Gastrostomy status: Secondary | ICD-10-CM

## 2016-09-19 DIAGNOSIS — J9600 Acute respiratory failure, unspecified whether with hypoxia or hypercapnia: Secondary | ICD-10-CM

## 2016-09-19 DIAGNOSIS — R6521 Severe sepsis with septic shock: Secondary | ICD-10-CM | POA: Diagnosis not present

## 2016-09-19 DIAGNOSIS — Z9911 Dependence on respirator [ventilator] status: Secondary | ICD-10-CM | POA: Diagnosis not present

## 2016-09-19 DIAGNOSIS — L8990 Pressure ulcer of unspecified site, unspecified stage: Secondary | ICD-10-CM

## 2016-09-19 DIAGNOSIS — Z87820 Personal history of traumatic brain injury: Secondary | ICD-10-CM

## 2016-09-19 DIAGNOSIS — J470 Bronchiectasis with acute lower respiratory infection: Secondary | ICD-10-CM | POA: Diagnosis present

## 2016-09-19 DIAGNOSIS — B379 Candidiasis, unspecified: Secondary | ICD-10-CM | POA: Diagnosis present

## 2016-09-19 DIAGNOSIS — E876 Hypokalemia: Secondary | ICD-10-CM | POA: Diagnosis present

## 2016-09-19 DIAGNOSIS — G931 Anoxic brain damage, not elsewhere classified: Secondary | ICD-10-CM | POA: Diagnosis present

## 2016-09-19 DIAGNOSIS — J9601 Acute respiratory failure with hypoxia: Secondary | ICD-10-CM

## 2016-09-19 DIAGNOSIS — I959 Hypotension, unspecified: Secondary | ICD-10-CM

## 2016-09-19 DIAGNOSIS — J9621 Acute and chronic respiratory failure with hypoxia: Secondary | ICD-10-CM | POA: Diagnosis present

## 2016-09-19 DIAGNOSIS — Z8701 Personal history of pneumonia (recurrent): Secondary | ICD-10-CM | POA: Diagnosis not present

## 2016-09-19 DIAGNOSIS — J9622 Acute and chronic respiratory failure with hypercapnia: Secondary | ICD-10-CM | POA: Diagnosis present

## 2016-09-19 DIAGNOSIS — K9423 Gastrostomy malfunction: Secondary | ICD-10-CM | POA: Diagnosis present

## 2016-09-19 DIAGNOSIS — Y95 Nosocomial condition: Secondary | ICD-10-CM | POA: Diagnosis present

## 2016-09-19 DIAGNOSIS — A419 Sepsis, unspecified organism: Principal | ICD-10-CM | POA: Diagnosis present

## 2016-09-19 DIAGNOSIS — E870 Hyperosmolality and hypernatremia: Secondary | ICD-10-CM | POA: Diagnosis present

## 2016-09-19 DIAGNOSIS — L89153 Pressure ulcer of sacral region, stage 3: Secondary | ICD-10-CM | POA: Diagnosis present

## 2016-09-19 DIAGNOSIS — J9690 Respiratory failure, unspecified, unspecified whether with hypoxia or hypercapnia: Secondary | ICD-10-CM | POA: Diagnosis present

## 2016-09-19 DIAGNOSIS — R0602 Shortness of breath: Secondary | ICD-10-CM | POA: Diagnosis present

## 2016-09-19 LAB — GLUCOSE, CAPILLARY
GLUCOSE-CAPILLARY: 105 mg/dL — AB (ref 65–99)
GLUCOSE-CAPILLARY: 129 mg/dL — AB (ref 65–99)
GLUCOSE-CAPILLARY: 162 mg/dL — AB (ref 65–99)
GLUCOSE-CAPILLARY: 187 mg/dL — AB (ref 65–99)
Glucose-Capillary: 100 mg/dL — ABNORMAL HIGH (ref 65–99)
Glucose-Capillary: 149 mg/dL — ABNORMAL HIGH (ref 65–99)

## 2016-09-19 LAB — COMPREHENSIVE METABOLIC PANEL
ALK PHOS: 124 U/L (ref 38–126)
ALT: 18 U/L (ref 17–63)
AST: 18 U/L (ref 15–41)
Albumin: 2.5 g/dL — ABNORMAL LOW (ref 3.5–5.0)
BILIRUBIN TOTAL: 1 mg/dL (ref 0.3–1.2)
BUN: 99 mg/dL — ABNORMAL HIGH (ref 6–20)
CALCIUM: 8 mg/dL — AB (ref 8.9–10.3)
CO2: >50 mmol/L — ABNORMAL HIGH (ref 22–32)
Chloride: 73 mmol/L — ABNORMAL LOW (ref 101–111)
Creatinine, Ser: 1.05 mg/dL (ref 0.61–1.24)
Glucose, Bld: 161 mg/dL — ABNORMAL HIGH (ref 65–99)
Potassium: 4.1 mmol/L (ref 3.5–5.1)
Sodium: 142 mmol/L (ref 135–145)
TOTAL PROTEIN: 7.8 g/dL (ref 6.5–8.1)

## 2016-09-19 LAB — PHOSPHORUS
Phosphorus: 3.1 mg/dL (ref 2.5–4.6)
Phosphorus: 1.6 mg/dL — ABNORMAL LOW (ref 2.5–4.6)

## 2016-09-19 LAB — CBC WITH DIFFERENTIAL/PLATELET
BASOS ABS: 0 10*3/uL (ref 0.0–0.1)
Basophils Relative: 0 %
EOS ABS: 0 10*3/uL (ref 0.0–0.7)
Eosinophils Relative: 0 %
HCT: 31.6 % — ABNORMAL LOW (ref 39.0–52.0)
Hemoglobin: 8.6 g/dL — ABNORMAL LOW (ref 13.0–17.0)
LYMPHS PCT: 5 %
Lymphs Abs: 1.5 10*3/uL (ref 0.7–4.0)
MCH: 26.4 pg (ref 26.0–34.0)
MCHC: 27.2 g/dL — ABNORMAL LOW (ref 30.0–36.0)
MCV: 96.9 fL (ref 78.0–100.0)
MONO ABS: 0.6 10*3/uL (ref 0.1–1.0)
Monocytes Relative: 2 %
NEUTROS PCT: 93 %
Neutro Abs: 27.6 10*3/uL — ABNORMAL HIGH (ref 1.7–7.7)
PLATELETS: 376 10*3/uL (ref 150–400)
RBC: 3.26 MIL/uL — ABNORMAL LOW (ref 4.22–5.81)
RDW: 17.1 % — AB (ref 11.5–15.5)
WBC: 29.7 10*3/uL — ABNORMAL HIGH (ref 4.0–10.5)

## 2016-09-19 LAB — MRSA PCR SCREENING: MRSA BY PCR: NEGATIVE

## 2016-09-19 LAB — URINALYSIS, ROUTINE W REFLEX MICROSCOPIC
Bilirubin Urine: NEGATIVE
GLUCOSE, UA: NEGATIVE mg/dL
KETONES UR: NEGATIVE mg/dL
NITRITE: NEGATIVE
PH: 5 (ref 5.0–8.0)
Protein, ur: 100 mg/dL — AB
SPECIFIC GRAVITY, URINE: 1.02 (ref 1.005–1.030)

## 2016-09-19 LAB — BLOOD GAS, ARTERIAL
Acid-Base Excess: 22.5 mmol/L — ABNORMAL HIGH (ref 0.0–2.0)
Bicarbonate: 50.1 mmol/L — ABNORMAL HIGH (ref 20.0–28.0)
Drawn by: 44135
FIO2: 60
MECHVT: 480 mL
O2 SAT: 97.1 %
PATIENT TEMPERATURE: 98.6
PCO2 ART: 109 mmHg — AB (ref 32.0–48.0)
PEEP: 8 cmH2O
PH ART: 7.284 — AB (ref 7.350–7.450)
PO2 ART: 102 mmHg (ref 83.0–108.0)
RATE: 20 resp/min

## 2016-09-19 LAB — BASIC METABOLIC PANEL
BUN: 72 mg/dL — ABNORMAL HIGH (ref 6–20)
CHLORIDE: 86 mmol/L — AB (ref 101–111)
CO2: >50 mmol/L — ABNORMAL HIGH (ref 22–32)
Calcium: 7.6 mg/dL — ABNORMAL LOW (ref 8.9–10.3)
Creatinine, Ser: 0.62 mg/dL (ref 0.61–1.24)
GFR calc non Af Amer: >60 mL/min (ref >60)
GLUCOSE: 145 mg/dL — AB (ref 65–99)
POTASSIUM: 2.8 mmol/L — AB (ref 3.5–5.1)
Sodium: 144 mmol/L (ref 135–145)

## 2016-09-19 LAB — CBC
HEMATOCRIT: 25 % — AB (ref 39.0–52.0)
Hemoglobin: 7.1 g/dL — ABNORMAL LOW (ref 13.0–17.0)
MCH: 27.2 pg (ref 26.0–34.0)
MCHC: 28.4 g/dL — AB (ref 30.0–36.0)
MCV: 95.8 fL (ref 78.0–100.0)
Platelets: 263 10*3/uL (ref 150–400)
RBC: 2.61 MIL/uL — AB (ref 4.22–5.81)
RDW: 17 % — ABNORMAL HIGH (ref 11.5–15.5)
WBC: 17.9 10*3/uL — ABNORMAL HIGH (ref 4.0–10.5)

## 2016-09-19 LAB — I-STAT CG4 LACTIC ACID, ED: Lactic Acid, Venous: 1.24 mmol/L (ref 0.5–1.9)

## 2016-09-19 LAB — MAGNESIUM
MAGNESIUM: 3.1 mg/dL — AB (ref 1.7–2.4)
MAGNESIUM: 2.7 mg/dL — AB (ref 1.7–2.4)

## 2016-09-19 MED ORDER — ARTIFICIAL TEARS OP OINT
TOPICAL_OINTMENT | Freq: Three times a day (TID) | OPHTHALMIC | Status: DC
  Administered 2016-09-19 (×2): via OPHTHALMIC
  Administered 2016-09-19 – 2016-09-20 (×4): 1 via OPHTHALMIC
  Administered 2016-09-21: 14:00:00 via OPHTHALMIC
  Administered 2016-09-21: 1 via OPHTHALMIC
  Administered 2016-09-21 – 2016-09-22 (×3): via OPHTHALMIC
  Administered 2016-09-22: 1 via OPHTHALMIC
  Administered 2016-09-23 – 2016-09-25 (×7): via OPHTHALMIC
  Administered 2016-09-25: 1 via OPHTHALMIC
  Administered 2016-09-25: 14:00:00 via OPHTHALMIC
  Administered 2016-09-26: 1 via OPHTHALMIC
  Administered 2016-09-26 – 2016-09-28 (×6): via OPHTHALMIC
  Filled 2016-09-19: qty 3.5

## 2016-09-19 MED ORDER — IPRATROPIUM-ALBUTEROL 0.5-2.5 (3) MG/3ML IN SOLN: 3.0000 mL | Freq: Four times a day (QID) | RESPIRATORY_TRACT | Status: DC | PRN

## 2016-09-19 MED ORDER — PANTOPRAZOLE SODIUM 40 MG PO TBEC: 40.0000 mg | DELAYED_RELEASE_TABLET | Freq: Every day | ORAL | Status: DC

## 2016-09-19 MED ORDER — SODIUM CHLORIDE 0.9 % IV SOLN
INTRAVENOUS | Status: DC
  Administered 2016-09-19 – 2016-09-20 (×2): via INTRAVENOUS

## 2016-09-19 MED ORDER — SODIUM CHLORIDE 0.9 % IV BOLUS (SEPSIS)
500.0000 mL | Freq: Once | INTRAVENOUS | Status: AC
  Administered 2016-09-19: 500 mL via INTRAVENOUS

## 2016-09-19 MED ORDER — DEXTROSE 5 % IV SOLN
2.0000 g | Freq: Once | INTRAVENOUS | Status: AC
  Administered 2016-09-19: 2 g via INTRAVENOUS
  Filled 2016-09-19: qty 2

## 2016-09-19 MED ORDER — DIAZEPAM 1 MG/ML PO SOLN
5.0000 mg | Freq: Four times a day (QID) | ORAL | Status: DC | PRN
  Administered 2016-09-22: 5 mg
  Filled 2016-09-19: qty 5

## 2016-09-19 MED ORDER — POTASSIUM CHLORIDE 10 MEQ/100ML IV SOLN
10.0000 meq | INTRAVENOUS | Status: AC
  Administered 2016-09-19 (×3): 10 meq via INTRAVENOUS
  Filled 2016-09-19 (×3): qty 100

## 2016-09-19 MED ORDER — POLYETHYLENE GLYCOL 3350 17 G PO PACK
17.0000 g | PACK | Freq: Two times a day (BID) | ORAL | Status: DC | PRN
  Filled 2016-09-19: qty 1

## 2016-09-19 MED ORDER — SODIUM CHLORIDE 0.9 % IV BOLUS (SEPSIS)
250.0000 mL | Freq: Once | INTRAVENOUS | Status: AC
  Administered 2016-09-19: 250 mL via INTRAVENOUS

## 2016-09-19 MED ORDER — LINEZOLID 600 MG/300ML IV SOLN
600.0000 mg | Freq: Two times a day (BID) | INTRAVENOUS | Status: DC
  Administered 2016-09-19 – 2016-09-21 (×4): 600 mg via INTRAVENOUS
  Filled 2016-09-19 (×4): qty 300

## 2016-09-19 MED ORDER — ADULT MULTIVITAMIN W/MINERALS CH
1.0000 | ORAL_TABLET | Freq: Every day | ORAL | Status: DC
  Administered 2016-09-19 – 2016-09-28 (×10): 1
  Filled 2016-09-19 (×10): qty 1

## 2016-09-19 MED ORDER — DIPHENHYDRAMINE HCL 25 MG PO TABS
25.0000 mg | ORAL_TABLET | Freq: Four times a day (QID) | ORAL | Status: DC | PRN
  Filled 2016-09-19: qty 1

## 2016-09-19 MED ORDER — NOREPINEPHRINE BITARTRATE 1 MG/ML IV SOLN
0.0000 ug/min | INTRAVENOUS | Status: DC
  Administered 2016-09-19: 6 ug/min via INTRAVENOUS
  Administered 2016-09-19: 30 ug/min via INTRAVENOUS
  Filled 2016-09-19 (×2): qty 4

## 2016-09-19 MED ORDER — SODIUM CHLORIDE 0.9 % IV BOLUS (SEPSIS)
1000.0000 mL | Freq: Once | INTRAVENOUS | Status: AC
  Administered 2016-09-19: 1000 mL via INTRAVENOUS

## 2016-09-19 MED ORDER — HEPARIN SODIUM (PORCINE) 5000 UNIT/ML IJ SOLN
5000.0000 [IU] | Freq: Three times a day (TID) | INTRAMUSCULAR | Status: DC
  Administered 2016-09-19 – 2016-09-28 (×28): 5000 [IU] via SUBCUTANEOUS
  Filled 2016-09-19 (×30): qty 1

## 2016-09-19 MED ORDER — PRO-STAT SUGAR FREE PO LIQD
30.0000 mL | Freq: Two times a day (BID) | ORAL | Status: DC
  Administered 2016-09-19: 30 mL
  Filled 2016-09-19 (×2): qty 30

## 2016-09-19 MED ORDER — FENTANYL CITRATE (PF) 100 MCG/2ML IJ SOLN
100.0000 ug | INTRAMUSCULAR | Status: DC | PRN
  Administered 2016-09-22: 100 ug via INTRAVENOUS
  Filled 2016-09-19 (×2): qty 2

## 2016-09-19 MED ORDER — MIDAZOLAM HCL 2 MG/2ML IJ SOLN
2.0000 mg | INTRAMUSCULAR | Status: DC | PRN
  Administered 2016-09-20 – 2016-09-27 (×5): 2 mg via INTRAVENOUS
  Filled 2016-09-19 (×5): qty 2

## 2016-09-19 MED ORDER — POLYETHYLENE GLYCOL 3350 17 G PO PACK: 17.0000 g | PACK | Freq: Two times a day (BID) | ORAL | Status: DC | PRN

## 2016-09-19 MED ORDER — NOREPINEPHRINE BITARTRATE 1 MG/ML IV SOLN
0.0000 ug/min | Freq: Once | INTRAVENOUS | Status: AC
  Administered 2016-09-19: 25 ug/min via INTRAVENOUS
  Filled 2016-09-19: qty 4

## 2016-09-19 MED ORDER — PIVOT 1.5 CAL PO LIQD
1000.0000 mL | ORAL | Status: DC
  Administered 2016-09-19: 1000 mL
  Filled 2016-09-19: qty 1000

## 2016-09-19 MED ORDER — ACETAMINOPHEN 160 MG/5ML PO SOLN
160.0000 mg | ORAL | Status: DC | PRN
  Administered 2016-09-22 – 2016-09-25 (×4): 160 mg
  Filled 2016-09-19 (×4): qty 20.3

## 2016-09-19 MED ORDER — ACETAMINOPHEN 160 MG/5ML PO SOLN: 160.0000 mg | ORAL | Status: DC | PRN

## 2016-09-19 MED ORDER — ORAL CARE MOUTH RINSE
15.0000 mL | Freq: Four times a day (QID) | OROMUCOSAL | Status: DC
  Administered 2016-09-19 – 2016-09-28 (×36): 15 mL via OROMUCOSAL

## 2016-09-19 MED ORDER — DEXTROSE 5 % IV SOLN
2.0000 g | Freq: Two times a day (BID) | INTRAVENOUS | Status: DC
  Administered 2016-09-19 – 2016-09-22 (×5): 2 g via INTRAVENOUS
  Filled 2016-09-19 (×6): qty 2

## 2016-09-19 MED ORDER — VITAMINS A & D EX OINT
1.0000 "application " | TOPICAL_OINTMENT | Freq: Two times a day (BID) | CUTANEOUS | Status: DC
  Administered 2016-09-19 – 2016-09-28 (×19): 1 via TOPICAL
  Filled 2016-09-19 (×19): qty 1

## 2016-09-19 MED ORDER — POLYVINYL ALCOHOL 1.4 % OP SOLN
1.0000 [drp] | Freq: Four times a day (QID) | OPHTHALMIC | Status: DC | PRN
  Administered 2016-09-19: 1 [drp] via OPHTHALMIC
  Filled 2016-09-19: qty 15

## 2016-09-19 MED ORDER — POTASSIUM CHLORIDE 20 MEQ/15ML (10%) PO SOLN
40.0000 meq | Freq: Two times a day (BID) | ORAL | Status: DC
  Administered 2016-09-19: 40 meq
  Filled 2016-09-19 (×2): qty 30

## 2016-09-19 MED ORDER — ZINC OXIDE 40 % EX OINT
TOPICAL_OINTMENT | Freq: Two times a day (BID) | CUTANEOUS | Status: DC | PRN
  Administered 2016-09-27: 18:00:00 via TOPICAL
  Filled 2016-09-19: qty 114

## 2016-09-19 MED ORDER — COLLAGENASE 250 UNIT/GM EX OINT
TOPICAL_OINTMENT | Freq: Every day | CUTANEOUS | Status: DC
  Administered 2016-09-19: 1 via TOPICAL
  Administered 2016-09-20 – 2016-09-27 (×7): via TOPICAL
  Filled 2016-09-19 (×2): qty 30

## 2016-09-19 MED ORDER — LINEZOLID 600 MG/300ML IV SOLN
600.0000 mg | Freq: Once | INTRAVENOUS | Status: AC
  Administered 2016-09-19: 600 mg via INTRAVENOUS
  Filled 2016-09-19: qty 300

## 2016-09-19 MED ORDER — SENNA 8.6 MG PO TABS
1.0000 | ORAL_TABLET | Freq: Every evening | ORAL | Status: DC | PRN
  Administered 2016-09-22: 8.6 mg
  Filled 2016-09-19 (×2): qty 1

## 2016-09-19 MED ORDER — LINEZOLID 600 MG/300ML IV SOLN: 600.0000 mg | Freq: Two times a day (BID) | INTRAVENOUS | Status: DC

## 2016-09-19 MED ORDER — DIPHENHYDRAMINE HCL 25 MG PO CAPS: 25.0000 mg | ORAL_CAPSULE | Freq: Four times a day (QID) | ORAL | Status: DC | PRN

## 2016-09-19 MED ORDER — PANTOPRAZOLE SODIUM 40 MG PO PACK
40.0000 mg | PACK | ORAL | Status: DC
  Administered 2016-09-19 – 2016-09-28 (×10): 40 mg
  Filled 2016-09-19 (×10): qty 20

## 2016-09-19 MED ORDER — CHLORHEXIDINE GLUCONATE 0.12% ORAL RINSE (MEDLINE KIT)
15.0000 mL | Freq: Two times a day (BID) | OROMUCOSAL | Status: DC
  Administered 2016-09-19 – 2016-09-28 (×18): 15 mL via OROMUCOSAL

## 2016-09-19 MED ORDER — DIAZEPAM 1 MG/ML PO SOLN
1.0000 mg | Freq: Two times a day (BID) | ORAL | Status: DC
  Administered 2016-09-19 – 2016-09-28 (×18): 1 mg
  Filled 2016-09-19 (×18): qty 5

## 2016-09-19 MED ORDER — FREE WATER
50.0000 mL | Freq: Four times a day (QID) | Status: DC
Start: 2016-09-19 — End: 2016-09-21
  Administered 2016-09-19 – 2016-09-21 (×6): 50 mL

## 2016-09-19 MED ORDER — PIVOT 1.5 CAL PO LIQD
1000.0000 mL | ORAL | Status: DC
  Administered 2016-09-19 – 2016-09-25 (×6): 1000 mL
  Filled 2016-09-19 (×7): qty 1000

## 2016-09-19 MED ORDER — BACLOFEN 5 MG HALF TABLET
15.0000 mg | ORAL_TABLET | Freq: Three times a day (TID) | ORAL | Status: DC
  Administered 2016-09-19 – 2016-09-28 (×26): 15 mg
  Filled 2016-09-19 (×31): qty 1

## 2016-09-19 MED ORDER — MENTHOL-ZINC OXIDE 0.44-20.6 % EX OINT: 1.0000 "application " | TOPICAL_OINTMENT | Freq: Two times a day (BID) | CUTANEOUS | Status: DC

## 2016-09-19 MED FILL — Norepinephrine Bitartrate IV Soln 1 MG/ML (Base Equivalent): INTRAVENOUS | Qty: 4 | Status: AC
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
## 2016-09-19 NOTE — ED Notes (Signed)
intensivist Provider at bedside placing central line

## 2016-09-19 NOTE — ED Notes (Signed)
Intensivist at bedside.
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
## 2016-09-19 NOTE — Progress Notes (Signed)
Pharmacy Antibiotic Note  Sean Palmer is a 26 y.o. male with h/o TIB on chronic vent, [facility] with HCVAP/sepsis.  Pharmacy has been consulted for Cefepime dosing.  Plan: Cefepime 2 g IV q12h  Height: 5\' 4"  (162.6 cm) Weight: 114 lb (51.7 kg) IBW/kg (Calculated) : 59.2  Temp (24hrs), Avg:99.7 F (37.6 C), Min:99.7 F (37.6 C), Max:99.7 F (37.6 C)   Recent Labs Lab 09/19/16 0250 09/19/16 0303  WBC 29.7*  --   CREATININE 1.05  --   LATICACIDVEN  --  1.24    Estimated Creatinine Clearance: 79.3 mL/min (by C-G formula based on SCr of 1.05 mg/dL).    Allergies  Allergen Reactions  . Vancomycin Other (See Comments)    redmans syndrome  . Piperacillin Other (See Comments)    Resistant per [facility] paperwork    Sean Palmer, Sean Palmer 09/19/2016 5:40 AM
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
## 2016-09-19 NOTE — Procedures (Signed)
Central Venous Catheter Insertion Procedure Note Sean Palmer [id number] 11-18-1990  Procedure: Insertion of Central Venous Catheter Indications: Assessment of intravascular volume, Drug and/or fluid administration and Frequent blood sampling  Procedure Details Consent: Unable to obtain consent because of emergent medical necessity. Time Out: Verified patient identification, verified procedure, site/side was marked, verified correct patient position, special equipment/implants available, medications/allergies/relevent history reviewed, required imaging and test results available.  Performed  Maximum sterile technique was used including antiseptics, cap, gloves, gown, hand hygiene, mask and sheet. Skin prep: Chlorhexidine; local anesthetic administered A antimicrobial bonded/coated triple lumen catheter was placed in the right subclavian vein using the Seldinger technique.  Evaluation Blood flow good Complications: No apparent complications Patient did tolerate procedure well. Chest X-ray ordered to verify placement.  CXR: pending.   [name], [place]PA - C [facility] Pager: [phone]  or [phone] 09/19/2016, 5:14 AM

## 2016-09-19 NOTE — Consult Note (Signed)
WOC Nurse wound consult note Reason for Consult:pressure ulcers Wound type:Unstageable, stage III, partial thickness Pressure Injury POA: Yes Measurement: R Ischium 4cm x 3.5cm x 0.75cm 100% yellow bed, copious drainage Wound bed: Sacrum 6cm x 5cm x 1cm with distal area separation 100% yellow bed, copious drainage Left  Ischium 1cm x 1cm healing stage III, 50% yellow, 50% red, small amt drainage R heel boggy, Left heel 2cm x 0.25cm partial thickness scratch, red bed no drainage Left neck healed ulcer, red, blanches, left ear 2cm x 0.25cm x 0.1cm partial thickness red bed no drainage, Left side of head 10 cm x 5cm area of multiple areas of healed pink friable skin, no drainage Drainage (amount, consistency, odor) see above Periwound: intact Dressing procedure/placement/frequency: I have provided nurses with orders for Santyl to sacral, and bilateral ischial wounds.  Foam to all other wounds to protect already healing skin.  Pt has boots from [name] that bedside RN placed on pt.  Pt is on a low air loss mattress.  If patient moves to the floor he will need a mattress replacement ordered. Patient's nutritional state unknown but will need assessment. The sacral wound is unstageable but to the bone,  if osteomyelitis is a concern please order an MRI to rule out. We will not follow, but will remain available to this patient, to nursing, and the medical and/or surgical teams.  Please re-consult if we need to assist further.    Barnett HatterMelinda Tejah Brekke, RN-C, WTA-C Wound Treatment Associate

## 2016-09-19 NOTE — Progress Notes (Signed)
eLink Physician-Brief Progress Note Patient Name: Sean Palmer DOB: 1990-12-15 MRN: [id number]   Date of Service  09/19/2016  HPI/Events of Note  Nurse reports leakage around PEG tube site. Patient reportedly had PEG tube changed in April. Evening medications and tube feedings held.   eICU Interventions  Checking CT abdomen without contrast to evaluate PEG tube placement.      Intervention Category Major Interventions: Other:  Lawanda CousinsJennings Melek Pownall 09/19/2016, 10:11 PM

## 2016-09-19 NOTE — Progress Notes (Signed)
Patient transported from ED to 2M09 with no complications.

## 2016-09-20 ENCOUNTER — Inpatient Hospital Stay (HOSPITAL_COMMUNITY): Payer: Medicare Other

## 2016-09-20 DIAGNOSIS — A419 Sepsis, unspecified organism: Secondary | ICD-10-CM | POA: Diagnosis not present

## 2016-09-20 DIAGNOSIS — J9601 Acute respiratory failure with hypoxia: Secondary | ICD-10-CM | POA: Diagnosis not present

## 2016-09-20 DIAGNOSIS — E876 Hypokalemia: Secondary | ICD-10-CM | POA: Diagnosis not present

## 2016-09-20 DIAGNOSIS — R0602 Shortness of breath: Secondary | ICD-10-CM | POA: Diagnosis not present

## 2016-09-20 DIAGNOSIS — J189 Pneumonia, unspecified organism: Secondary | ICD-10-CM | POA: Diagnosis not present

## 2016-09-20 LAB — BASIC METABOLIC PANEL
Anion gap: 4 — ABNORMAL LOW (ref 5–15)
BUN: 36 mg/dL — ABNORMAL HIGH (ref 6–20)
CO2: 48 mmol/L — ABNORMAL HIGH (ref 22–32)
CREATININE: 0.34 mg/dL — AB (ref 0.61–1.24)
Calcium: 8.3 mg/dL — ABNORMAL LOW (ref 8.9–10.3)
Chloride: 97 mmol/L — ABNORMAL LOW (ref 101–111)
Glucose, Bld: 95 mg/dL (ref 65–99)
POTASSIUM: 2.8 mmol/L — AB (ref 3.5–5.1)
SODIUM: 149 mmol/L — AB (ref 135–145)

## 2016-09-20 LAB — GLUCOSE, CAPILLARY
GLUCOSE-CAPILLARY: 107 mg/dL — AB (ref 65–99)
GLUCOSE-CAPILLARY: 86 mg/dL (ref 65–99)
GLUCOSE-CAPILLARY: 89 mg/dL (ref 65–99)
GLUCOSE-CAPILLARY: 91 mg/dL (ref 65–99)
Glucose-Capillary: 89 mg/dL (ref 65–99)
Glucose-Capillary: 88 mg/dL (ref 65–99)

## 2016-09-20 LAB — CBC WITH DIFFERENTIAL/PLATELET
BASOS ABS: 0 10*3/uL (ref 0.0–0.1)
Basophils Relative: 0 %
EOS ABS: 0.2 10*3/uL (ref 0.0–0.7)
Eosinophils Relative: 3 %
HCT: 25.3 % — ABNORMAL LOW (ref 39.0–52.0)
Hemoglobin: 7 g/dL — ABNORMAL LOW (ref 13.0–17.0)
Lymphocytes Relative: 6 %
Lymphs Abs: 0.5 10*3/uL — ABNORMAL LOW (ref 0.7–4.0)
MCH: 26.5 pg (ref 26.0–34.0)
MCHC: 27.7 g/dL — ABNORMAL LOW (ref 30.0–36.0)
MCV: 95.8 fL (ref 78.0–100.0)
Monocytes Absolute: 0.4 10*3/uL (ref 0.1–1.0)
Monocytes Relative: 4 %
NEUTROS PCT: 87 %
Neutro Abs: 7.8 10*3/uL — ABNORMAL HIGH (ref 1.7–7.7)
PLATELETS: 215 10*3/uL (ref 150–400)
RBC: 2.64 MIL/uL — AB (ref 4.22–5.81)
RDW: 16.7 % — ABNORMAL HIGH (ref 11.5–15.5)
WBC: 9 10*3/uL (ref 4.0–10.5)

## 2016-09-20 LAB — PHOSPHORUS
PHOSPHORUS: 1.9 mg/dL — AB (ref 2.5–4.6)
PHOSPHORUS: 2.6 mg/dL (ref 2.5–4.6)

## 2016-09-20 LAB — MAGNESIUM
MAGNESIUM: 2.2 mg/dL (ref 1.7–2.4)
Magnesium: 2.5 mg/dL — ABNORMAL HIGH (ref 1.7–2.4)

## 2016-09-20 MED ORDER — SODIUM CHLORIDE 3 % IN NEBU
4.0000 mL | INHALATION_SOLUTION | Freq: Two times a day (BID) | RESPIRATORY_TRACT | Status: AC
  Administered 2016-09-20 – 2016-09-22 (×6): 4 mL via RESPIRATORY_TRACT
  Filled 2016-09-20 (×7): qty 4

## 2016-09-20 MED ORDER — GUAIFENESIN 100 MG/5ML PO SOLN
5.0000 mL | Freq: Three times a day (TID) | ORAL | Status: DC
  Administered 2016-09-20 – 2016-09-28 (×24): 100 mg via ORAL
  Filled 2016-09-20 (×26): qty 5

## 2016-09-20 MED ORDER — DEXTROSE 5 % IV SOLN
20.0000 mmol | Freq: Once | INTRAVENOUS | Status: AC
  Administered 2016-09-20: 20 mmol via INTRAVENOUS
  Filled 2016-09-20: qty 6.67

## 2016-09-20 MED ORDER — POTASSIUM CHLORIDE 20 MEQ/15ML (10%) PO SOLN
40.0000 meq | ORAL | Status: AC
  Administered 2016-09-20 (×2): 40 meq via ORAL
  Filled 2016-09-20: qty 30

## 2016-09-20 MED ORDER — SODIUM CHLORIDE 0.9% FLUSH: 10.0000 mL | INTRAVENOUS | Status: DC | PRN

## 2016-09-20 MED ORDER — SODIUM CHLORIDE 0.9% FLUSH
10.0000 mL | Freq: Two times a day (BID) | INTRAVENOUS | Status: DC
  Administered 2016-09-20 (×2): 10 mL

## 2016-09-20 MED ORDER — CHLORHEXIDINE GLUCONATE CLOTH 2 % EX PADS
6.0000 | MEDICATED_PAD | Freq: Every day | CUTANEOUS | Status: DC
  Administered 2016-09-20: 6 via TOPICAL

## 2016-09-20 NOTE — Progress Notes (Signed)
RT note- No changes with ventilator at this time, continue to monitor.

## 2016-09-20 NOTE — Progress Notes (Signed)
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    SUBJECTIVE:  Drainage from around G tube> CT abdomen shows G tube is in place  VITAL SIGNS: BP 116/70   Pulse (!) 102   Temp 97.2 F (36.2 C) (Oral)   Resp (!) 21   Ht 5\' 8"  (1.727 m)   Wt 53.3 kg (117 lb 8.1 oz)   SpO2 96%   BMI 17.87 kg/m   HEMODYNAMICS:    VENTILATOR SETTINGS: Vent Mode: PRVC FiO2 (%):  [40 %-50 %] 40 % Set Rate:  [26 bmp] 26 bmp Vt Set:  [480 mL] 480 mL PEEP:  [8 cmH20] 8 cmH20 Plateau Pressure:  [32 cmH20-34 cmH20] 34 cmH20  INTAKE / OUTPUT: I/O last 3 completed shifts: In: 8904 [I.V.:1380.5; Other:30; NG/GT:343.5; IV Piggyback:7150] Out: 2410 [Urine:2410]  PHYSICAL EXAMINATION: General:  Resting comfortably in bed HENT: NCAT trach in place PULM: wheeze left lung B, vent supported breathing CV: RRR, no mgr GI: G tube in place MSK: normal bulk and tone Neuro: no response to external stimuli for me   LABS:  BMET  Recent Labs Lab 09/19/16 0250 09/19/16 0824 09/20/16 0435  NA 142 144 149*  K 4.1 2.8* 2.8*  CL 73* 86* 97*  CO2 >50* >50* 48*  BUN 99* 72* 36*  CREATININE 1.05 0.62 0.34*  GLUCOSE 161* 145* 95    Electrolytes  Recent Labs Lab 09/19/16 0250 09/19/16 0824 09/19/16 1712 09/20/16 0435  CALCIUM 8.0* 7.6*  --  8.3*  MG  --  3.1* 2.7* 2.5*  PHOS  --  3.1 1.6* 1.9*    CBC  Recent Labs Lab 09/19/16 0250 09/19/16 0824 09/20/16 0435  WBC 29.7* 17.9* 9.0  HGB 8.6* 7.1* 7.0*  HCT 31.6* 25.0* 25.3*  PLT 376 263 215    Coag's No results for input(s): APTT, INR in the  last 168 hours.  Sepsis Markers  Recent Labs Lab 09/19/16 0303  LATICACIDVEN 1.24    ABG  Recent Labs Lab 09/19/16 0540  PHART 7.284*  PCO2ART 109*  PO2ART 102    Liver Enzymes  Recent Labs Lab 09/19/16 0250  AST 18  ALT 18  ALKPHOS 124  BILITOT 1.0  ALBUMIN 2.5*    Cardiac Enzymes No results for input(s): TROPONINI, PROBNP in the last 168 hours.  Glucose  Recent Labs Lab 09/19/16 1150 09/19/16 1611 09/19/16 1951 09/20/16 0000 09/20/16 0350 09/20/16 0742  GLUCAP 100* 105* 149* 129* 86 91    Imaging Ct Abdomen Wo Contrast  Result Date: 09/20/2016 CLINICAL DATA:  Leaking PEG tube. EXAM: CT ABDOMEN WITHOUT CONTRAST TECHNIQUE: Multidetector CT imaging of the abdomen was performed following the standard protocol without IV contrast. COMPARISON:  CT abdomen and pelvis June 07, 2016 FINDINGS: Lower chest: Improved though persistent tree-in-bud infiltrates in included RIGHT lung with patchy consolidation RIGHT lower lobe. LEFT lower lobe bronchiectasis and consolidation is unchanged. Included heart is displaced to the LEFT, limited assessment without contrast. Hepatobiliary: Normal noncontrast CT liver. Subcentimeter layering gallstones without CT findings of acute cholecystitis. Pancreas: Negative. Spleen: Stable splenomegaly. Adrenals/Urinary Tract: Adrenal glands are  normal. Kidneys are orthotopic with normal morphology parrot 11 mm unchanged LEFT upper pole nephrolithiasis. No hydronephrosis. Stomach/Bowel: Gastrostomy tube retaining loop LEFT upper quadrant, due to respiratory motion lack of contrast, precise localization limited though, likely intraluminal. Intraluminal gastrostomy tube distal tip in third portion of duodenum. Vascular/Lymphatic: Intravenous filter unchanged in position, appearing at the level the renal veins. Aorta is normal course and caliber. Other: No intraperitoneal free fluid or free air in the upper abdomen. Musculoskeletal: Osteopenia with  old mild L1 through L5 5 compression fractures. Soft tissues are nonsuspicious. IMPRESSION: Intraluminal gastrostomy tube, distal tip at level of the third portion of the duodenum. Retaining bulb is likely intraluminal though, contrast injection with confirm position. Nonobstructing LEFT upper pole nephrolithiasis. Stable splenomegaly. Electronically Signed   By: Awilda Metroourtnay  Bloomer M.D.   On: 09/20/2016 04:24   Dg Chest Port 1 View  Result Date: 09/20/2016 CLINICAL DATA:  26 year old male status [facility] 26 with baseline GCS of 3. Chronic tracheostomy. Prior left lung lobectomy, with chronic bronchiectasis and essentially nonfunctional left lung. Treated for Pseudomonas pneumonia in April. Presented with shortness of breath and hypotension. EXAM: PORTABLE CHEST 1 VIEW COMPARISON:  09/19/2016 and earlier. FINDINGS: Portable AP semi upright view at 0455 hours. Rotated to the left. Stable tracheostomy tube. Right subclavian approach central line now in place with tip at the level of the SVC about 1 vertebral body below the carina. Unchanged confluent opacity throughout the left lung with superimposed perihilar bronchiectasis in a row bronchograms. Mild apical predominant nodular opacity in the right lung has progressed. No right pleural effusion, pneumothorax or pulmonary edema. No right lung consolidation. IMPRESSION: 1. Mildly progressed nodular apical right lung opacity. 2. Stable chronic left lung opacification with bronchiectasis. 3. Stable tracheostomy tube. Right subclavian approach central line tip projects at the SVC level. Electronically Signed   By: Odessa FlemingH  Hall M.D.   On: 09/20/2016 07:29     STUDIES:  5/29 CT abdomen > feeding tube in duodenum, nephrolithiasis  CULTURES: 5/29 blood > 5/30 resp >   ANTIBIOTICS: 5/29 cefepime>  5/29 zyvox>  SIGNIFICANT EVENTS:   LINES/TUBES: Chronic tracheostomy Chronic G-J tube  DISCUSSION: 26 y/o male with TBI here  with acute on chronic respiratory failure due to a bronchiectasis flare.  ASSESSMENT / PLAN:  PULMONARY A: Acute on chronic respiratory failure with hypoxemia Bronchiectasis flare P:   Add hypertonic saline neb bid Add guaifenesin Continue full vent support  CARDIOVASCULAR A:  Septic shock> resolved P:  Tele Monitor BP  RENAL A:   Hypokalemia Hypophosphatemia Hypernatremia P:   Replace K aggressively Continue free water Stop saline Replace phos Monitor BMET and UOP Replace electrolytes as needed  GASTROINTESTINAL A:   Chronic G tube> drainage around it P:   G-tube check with contrast   HEMATOLOGIC A:   Chronic Anemia without bleeding P:  Transfuse if Hgb < 7gm/dL  INFECTIOUS A:   HCAP Bronchiectasis flare P:   F/u cultures Continue zyvox/cefepime  ENDOCRINE A:   No acute issues   P:   Monitor glucose  NEUROLOGIC A:   Anoxic brain injury P:   RASS goal: 0    FAMILY  - Updates: none bedside  My cc time 32 minutes  Heber CarolinaBrent McQuaid, MD [place] PCCM Pager: [phone] Cell: [phone] After 3pm or if no response, call [phone]   09/20/2016, 9:09 AM

## 2016-09-20 NOTE — Progress Notes (Signed)
eLink Physician-Brief Progress Note Patient Name: Sean Palmer DOB: Jul 13, 1990 MRN: [id number]   Date of Service  09/20/2016  HPI/Events of Note  Multiple issues: 1. Hypothermia - Temp > 93.1 F and 2. Bradycardia HR 50's to low 60's. Related to patient being turned. Patient had episode of bradycardia related to be turned earlier today. HR now = 65.  eICU Interventions  Will order: 1. [employer].      Intervention Category Major Interventions: Infection - evaluation and management  Sommer,Steven Eugene 09/20/2016, 11:52 PM

## 2016-09-20 NOTE — Care Management Note (Signed)
Case Management Note  Patient Details  Name: Sean Palmer MRN: [id number] Date of Birth: Jan 08, 1991  Subjective/Objective:   Pt admitted with acute resp failure                 Action/Plan:   PTA from [facility] - per attending pt will be likely ready to discharge back to SNF within 24 -48 hours  - CSW made aware   Expected Discharge Date:                  Expected Discharge Plan:  Skilled Nursing Facility  In-House Referral:  Clinical Social Work  Discharge planning Services  CM Consult  Post Acute Care Choice:    Choice offered to:     DME Arranged:    DME Agency:     HH Arranged:    HH Agency:     Status of Service:     If discussed at [employer] Length [employer] Meetings, dates discussed:    Additional Comments:  Cherylann ParrClaxton, Anja Neuzil S, RN 09/20/2016, 2:52 PM

## 2016-09-21 ENCOUNTER — Inpatient Hospital Stay (HOSPITAL_COMMUNITY): Payer: Medicare Other

## 2016-09-21 ENCOUNTER — Encounter (HOSPITAL_COMMUNITY): Payer: Self-pay | Admitting: Interventional Radiology

## 2016-09-21 DIAGNOSIS — J9612 Chronic respiratory failure with hypercapnia: Secondary | ICD-10-CM

## 2016-09-21 DIAGNOSIS — R0602 Shortness of breath: Secondary | ICD-10-CM | POA: Diagnosis not present

## 2016-09-21 DIAGNOSIS — J9601 Acute respiratory failure with hypoxia: Secondary | ICD-10-CM | POA: Diagnosis not present

## 2016-09-21 DIAGNOSIS — A419 Sepsis, unspecified organism: Secondary | ICD-10-CM | POA: Diagnosis not present

## 2016-09-21 DIAGNOSIS — J189 Pneumonia, unspecified organism: Secondary | ICD-10-CM | POA: Diagnosis not present

## 2016-09-21 HISTORY — PX: IR CM INJ ANY COLONIC TUBE W/FLUORO: IMG2336

## 2016-09-21 LAB — HEMOGLOBIN AND HEMATOCRIT, BLOOD
HCT: 26.3 % — ABNORMAL LOW (ref 39.0–52.0)
Hemoglobin: 7.4 g/dL — ABNORMAL LOW (ref 13.0–17.0)

## 2016-09-21 LAB — BLOOD GAS, ARTERIAL
Acid-Base Excess: 8 mmol/L — ABNORMAL HIGH (ref 0.0–2.0)
Bicarbonate: 32.7 mmol/L — ABNORMAL HIGH (ref 20.0–28.0)
Drawn by: 40415
FIO2: 40
O2 Saturation: 98.6 %
PATIENT TEMPERATURE: 98.6
PCO2 ART: 52.7 mmHg — AB (ref 32.0–48.0)
PEEP: 5 cmH2O
RATE: 26 resp/min
VT: 480 mL
pH, Arterial: 7.41 (ref 7.350–7.450)
pO2, Arterial: 134 mmHg — ABNORMAL HIGH (ref 83.0–108.0)

## 2016-09-21 LAB — CBC WITH DIFFERENTIAL/PLATELET
Basophils Absolute: 0 10*3/uL (ref 0.0–0.1)
Basophils Relative: 0 %
EOS PCT: 3 %
Eosinophils Absolute: 0.2 10*3/uL (ref 0.0–0.7)
HCT: 27.9 % — ABNORMAL LOW (ref 39.0–52.0)
Hemoglobin: 7.8 g/dL — ABNORMAL LOW (ref 13.0–17.0)
LYMPHS ABS: 0.9 10*3/uL (ref 0.7–4.0)
LYMPHS PCT: 14 %
MCH: 26.2 pg (ref 26.0–34.0)
MCHC: 28 g/dL — ABNORMAL LOW (ref 30.0–36.0)
MCV: 93.6 fL (ref 78.0–100.0)
MONO ABS: 0.4 10*3/uL (ref 0.1–1.0)
Monocytes Relative: 6 %
Neutro Abs: 4.7 10*3/uL (ref 1.7–7.7)
Neutrophils Relative %: 77 %
PLATELETS: 165 10*3/uL (ref 150–400)
RBC: 2.98 MIL/uL — AB (ref 4.22–5.81)
RDW: 16.9 % — AB (ref 11.5–15.5)
WBC: 6.2 10*3/uL (ref 4.0–10.5)

## 2016-09-21 LAB — APTT: aPTT: 61 seconds — ABNORMAL HIGH (ref 24–36)

## 2016-09-21 LAB — LACTIC ACID, PLASMA: Lactic Acid, Venous: 0.5 mmol/L (ref 0.5–1.9)

## 2016-09-21 LAB — GLUCOSE, CAPILLARY
GLUCOSE-CAPILLARY: 126 mg/dL — AB (ref 65–99)
GLUCOSE-CAPILLARY: 69 mg/dL (ref 65–99)
GLUCOSE-CAPILLARY: 86 mg/dL (ref 65–99)
Glucose-Capillary: 106 mg/dL — ABNORMAL HIGH (ref 65–99)

## 2016-09-21 LAB — BASIC METABOLIC PANEL
ANION GAP: 3 — AB (ref 5–15)
Anion gap: 7 (ref 5–15)
BUN: 27 mg/dL — ABNORMAL HIGH (ref 6–20)
BUN: 20 mg/dL (ref 6–20)
CALCIUM: 8.4 mg/dL — AB (ref 8.9–10.3)
CO2: 41 mmol/L — ABNORMAL HIGH (ref 22–32)
CO2: 37 mmol/L — AB (ref 22–32)
CREATININE: 0.4 mg/dL — AB (ref 0.61–1.24)
Calcium: 7.9 mg/dL — ABNORMAL LOW (ref 8.9–10.3)
Chloride: 104 mmol/L (ref 101–111)
Chloride: 113 mmol/L — ABNORMAL HIGH (ref 101–111)
Creatinine, Ser: 0.3 mg/dL — ABNORMAL LOW (ref 0.61–1.24)
GFR calc Af Amer: >60 mL/min (ref >60)
GFR calc non Af Amer: >60 mL/min (ref >60)
Glucose, Bld: 130 mg/dL — ABNORMAL HIGH (ref 65–99)
Glucose, Bld: 91 mg/dL (ref 65–99)
POTASSIUM: 5.1 mmol/L (ref 3.5–5.1)
Potassium: 2.9 mmol/L — ABNORMAL LOW (ref 3.5–5.1)
SODIUM: 152 mmol/L — AB (ref 135–145)
Sodium: 153 mmol/L — ABNORMAL HIGH (ref 135–145)

## 2016-09-21 LAB — PHOSPHORUS: PHOSPHORUS: 2.9 mg/dL (ref 2.5–4.6)

## 2016-09-21 LAB — PROTIME-INR
INR: 1.47
Prothrombin Time: 18 seconds — ABNORMAL HIGH (ref 11.4–15.2)

## 2016-09-21 LAB — CBC
HCT: 24.7 % — ABNORMAL LOW (ref 39.0–52.0)
Hemoglobin: 6.7 g/dL — CL (ref 13.0–17.0)
MCH: 26.1 pg (ref 26.0–34.0)
MCHC: 27.1 g/dL — AB (ref 30.0–36.0)
MCV: 96.1 fL (ref 78.0–100.0)
Platelets: 213 10*3/uL (ref 150–400)
RBC: 2.57 MIL/uL — ABNORMAL LOW (ref 4.22–5.81)
RDW: 16.7 % — AB (ref 11.5–15.5)
WBC: 5.8 10*3/uL (ref 4.0–10.5)

## 2016-09-21 LAB — PREPARE RBC (CROSSMATCH)

## 2016-09-21 LAB — CORTISOL: Cortisol, Plasma: 11.5 ug/dL

## 2016-09-21 LAB — TROPONIN I

## 2016-09-21 LAB — ABO/RH: ABO/RH(D): O POS

## 2016-09-21 MED ORDER — FREE WATER
200.0000 mL | Freq: Four times a day (QID) | Status: DC
  Administered 2016-09-21 – 2016-09-22 (×3): 200 mL

## 2016-09-21 MED ORDER — HYDROCORTISONE NA SUCCINATE PF 100 MG IJ SOLR
50.0000 mg | Freq: Four times a day (QID) | INTRAMUSCULAR | Status: DC
  Administered 2016-09-21 – 2016-09-28 (×26): 50 mg via INTRAVENOUS
  Filled 2016-09-21 (×8): qty 1
  Filled 2016-09-21: qty 2
  Filled 2016-09-21 (×3): qty 1
  Filled 2016-09-21: qty 2
  Filled 2016-09-21 (×7): qty 1
  Filled 2016-09-21: qty 2
  Filled 2016-09-21: qty 1
  Filled 2016-09-21: qty 2
  Filled 2016-09-21 (×6): qty 1

## 2016-09-21 MED ORDER — IOPAMIDOL (ISOVUE-300) INJECTION 61%
INTRAVENOUS | Status: AC
  Administered 2016-09-21: 15 mL
  Filled 2016-09-21: qty 50

## 2016-09-21 MED ORDER — DEXTROSE 50 % IV SOLN
25.0000 mL | Freq: Once | INTRAVENOUS | Status: AC
  Administered 2016-09-21: 25 mL via INTRAVENOUS
  Filled 2016-09-21: qty 50

## 2016-09-21 MED ORDER — SODIUM CHLORIDE 0.9 % IV BOLUS (SEPSIS): 1000.0000 mL | Freq: Once | INTRAVENOUS | Status: DC

## 2016-09-21 MED ORDER — DEXTROSE 50 % IV SOLN
INTRAVENOUS | Status: AC
  Administered 2016-09-21: 25 mL via INTRAVENOUS
  Filled 2016-09-21: qty 50

## 2016-09-21 MED ORDER — LIDOCAINE VISCOUS 2 % MT SOLN
OROMUCOSAL | Status: AC
  Filled 2016-09-21: qty 15

## 2016-09-21 MED ORDER — SODIUM CHLORIDE 0.9 % IV SOLN
0.0000 ug/min | INTRAVENOUS | Status: DC
  Administered 2016-09-21: 20 ug/min via INTRAVENOUS
  Administered 2016-09-22: 150 ug/min via INTRAVENOUS
  Administered 2016-09-22: 80 ug/min via INTRAVENOUS
  Administered 2016-09-22: 160 ug/min via INTRAVENOUS
  Filled 2016-09-21 (×9): qty 1

## 2016-09-21 MED ORDER — SODIUM CHLORIDE 0.9 % IV BOLUS (SEPSIS)
1000.0000 mL | Freq: Once | INTRAVENOUS | Status: AC
  Administered 2016-09-21: 1000 mL via INTRAVENOUS

## 2016-09-21 MED ORDER — DEXTROSE 5 % IV SOLN
INTRAVENOUS | Status: DC
  Administered 2016-09-21 – 2016-09-22 (×2): via INTRAVENOUS

## 2016-09-21 MED ORDER — POTASSIUM CHLORIDE 10 MEQ/50ML IV SOLN
10.0000 meq | INTRAVENOUS | Status: AC
  Administered 2016-09-21 (×6): 10 meq via INTRAVENOUS
  Filled 2016-09-21 (×6): qty 50

## 2016-09-21 MED ORDER — SODIUM CHLORIDE 0.9 % IV BOLUS (SEPSIS)
500.0000 mL | Freq: Once | INTRAVENOUS | Status: AC
  Administered 2016-09-21: 500 mL via INTRAVENOUS

## 2016-09-21 MED ORDER — SODIUM CHLORIDE 0.9 % IV SOLN: Freq: Once | INTRAVENOUS | Status: DC

## 2016-09-21 MED ORDER — DEXTROSE-NACL 5-0.9 % IV SOLN: INTRAVENOUS | Status: DC

## 2016-09-21 MED ORDER — SODIUM CHLORIDE 0.9 % IV SOLN
Freq: Once | INTRAVENOUS | Status: AC
  Administered 2016-09-21: 11:00:00 via INTRAVENOUS

## 2016-09-21 MED ORDER — VANCOMYCIN HCL IN DEXTROSE 750-5 MG/150ML-% IV SOLN
750.0000 mg | Freq: Two times a day (BID) | INTRAVENOUS | Status: DC
  Administered 2016-09-22 – 2016-09-28 (×14): 750 mg via INTRAVENOUS
  Filled 2016-09-21 (×14): qty 150

## 2016-09-21 NOTE — Progress Notes (Signed)
PULMONARY / CRITICAL CARE MEDICINE   Name: Sean Palmer MRN: [id number] DOB: 01-16-1991    ADMISSION DATE:  09/19/2016 CONSULTATION DATE:  09/19/16  REFERRING MD:  Horton  CHIEF COMPLAINT:  Dyspnea  HISTORY OF PRESENT ILLNESS:   26 y/o male with a history of TBI at age 26 who resides at [name], chronic tracheostomy and chronic ventilatory support admitted on 5/29 with acute respiratory failure with hypoxemia due to bronchiectasis.    SUBJECTIVE:  Hgb down No acute issues Some G tube drainage  VITAL SIGNS: BP 104/65 (BP Location: Left Arm)   Pulse 88   Temp 97.5 F (36.4 C) (Oral)   Resp (!) 23   Ht 5\' 8"  (1.727 m)   Wt 52.6 kg (115 lb 15.4 oz)   SpO2 100%   BMI 17.63 kg/m   HEMODYNAMICS: CVP:  [3 mmHg] 3 mmHg  VENTILATOR SETTINGS: Vent Mode: PRVC FiO2 (%):  [40 %] 40 % Set Rate:  [26 bmp] 26 bmp Vt Set:  [480 mL] 480 mL PEEP:  [5 cmH20-8 cmH20] 5 cmH20 Plateau Pressure:  [26 cmH20-31 cmH20] 26 cmH20  INTAKE / OUTPUT: I/O last 3 completed shifts: In: 4324.4 [I.V.:1272.7; Other:145; NG/GT:300; IV Piggyback:2606.7] Out: 1085 [Urine:1085]  PHYSICAL EXAMINATION:  Gen: chronically ill appearing on vent HENT: trach in place PULM: wheezes on left, normal on right CV: RRR, no mgr GI PEG in place, nontender Derm: no rash Neuro: eyes open, mouth moves spontaneously, sometimes turns head, non-verbal   LABS:  BMET  Recent Labs Lab 09/19/16 0824 09/20/16 0435 09/21/16 0420  NA 144 149* 152*  K 2.8* 2.8* 2.9*  CL 86* 97* 104  CO2 >50* 48* 41*  BUN 72* 36* 27*  CREATININE 0.62 0.34* 0.40*  GLUCOSE 145* 95 130*    Electrolytes  Recent Labs Lab 09/19/16 0824 09/19/16 1712 09/20/16 0435 09/20/16 1625 09/21/16 0420  CALCIUM 7.6*  --  8.3*  --  8.4*  MG 3.1* 2.7* 2.5* 2.2  --   PHOS 3.1 1.6* 1.9* 2.6  --     CBC  Recent Labs Lab 09/19/16 0824 09/20/16 0435 09/21/16 0420  WBC 17.9* 9.0 5.8  HGB 7.1* 7.0* 6.7*  HCT 25.0* 25.3* 24.7*   PLT 263 215 213    Coag's No results for input(s): APTT, INR in the last 168 hours.  Sepsis Markers  Recent Labs Lab 09/19/16 0303  LATICACIDVEN 1.24    ABG  Recent Labs Lab 09/19/16 0540  PHART 7.284*  PCO2ART 109*  PO2ART 102    Liver Enzymes  Recent Labs Lab 09/19/16 0250  AST 18  ALT 18  ALKPHOS 124  BILITOT 1.0  ALBUMIN 2.5*    Cardiac Enzymes No results for input(s): TROPONINI, PROBNP in the last 168 hours.  Glucose  Recent Labs Lab 09/20/16 1604 09/20/16 2006 09/20/16 2336 09/21/16 0352 09/21/16 0424 09/21/16 0806  GLUCAP 89 88 89 69 126* 106*    Imaging No results found.   STUDIES:  5/29 CT abdomen > feeding tube in duodenum, nephrolithiasis  CULTURES: 5/29 blood > 5/30 resp > gram neg rods and coccobacilli  ANTIBIOTICS: 5/29 cefepime>  5/29 zyvox>  SIGNIFICANT EVENTS:   LINES/TUBES: Chronic tracheostomy Chronic G-J tube  DISCUSSION: 26 y/o male with TBI here with acute on chronic respiratory failure due to a bronchiectasis flare.  ASSESSMENT / PLAN:  PULMONARY A: Acute on chronic respiratory failure with hypoxemia Bronchiectasis flare P:   Continue hypertonic saline neb bid continue guaifenesin Continue full vent support  CARDIOVASCULAR A:  No acute issues P:  Tele Monitor BP  RENAL A:   Hypokalemia Hypophosphatemia Hypernatremia P:   Replace K aggressively again today Increase free water to 200 ml q6h Repeat phos Monitor BMET and UOP Replace electrolytes as needed  GASTROINTESTINAL A:   Chronic G tube> drainage around it (per GI this is a chronic issue) P:   G-tube check with contrast   HEMATOLOGIC A:   Chronic Anemia without bleeding P:  Transfuse if Hgb < 7gm/dL> 1 unit today  INFECTIOUS A:   HCAP Bronchiectasis flare P:   F/u cultures Continue zyvox/cefepime  ENDOCRINE A:   No acute issues   P:   Monitor glucose  NEUROLOGIC A:   Anoxic brain injury P:   RASS goal:  0   FAMILY  - Updates: none bedside  Transfer to SDU  Heber [place], MD [place] PCCM Pager: [phone] Cell: [phone] After 3pm or if no response, call [phone]   09/21/2016, 8:53 AM

## 2016-09-21 NOTE — Progress Notes (Signed)
CRITICAL VALUE ALERT  Critical Value:  Hgb 6.7, K 2.9  Date & Time Notied:  09/21/16 0600  Provider Notified: CCM ELINK  Orders Received/Actions taken: [id number]

## 2016-09-21 NOTE — Progress Notes (Signed)
Patient transported from 4NP06 on the ventilator with no complications.   09/21/16 2314  Vent Select  Invasive or Noninvasive Invasive  Adult Vent Y  Tracheostomy Portex 7 mm Cuffed  Placement Date/Time: (c) 09/19/16 [id number]   Placed By: ICU physician  Brand: Portex  Size (mm): 7 mm  Style: Cuffed  Status Secured  Ties Assessment Secure;Dry;Clean  Tracheostomy Equipment at bedside Yes and checklist posted at head of bed  Adult Ventilator Settings  Vent Type Servo i  Humidity HME  Vent Mode PRVC  Vt Set 480 mL  Set Rate 26 bmp  FiO2 (%) 40 %  I Time 0.9 Sec(s)  PEEP 5 cmH20  Adult Ventilator Measurements  Peak Airway Pressure 44 L/min  Mean Airway Pressure 19 cmH20  Plateau Pressure 32 cmH20  Resp Rate Spontaneous 0 br/min  Resp Rate Total 26 br/min  Exhaled Vt 494 mL  Measured Ve 11.9 mL  I:E Ratio Measured 1:1.6  SpO2 100 %  Adult Ventilator Alarms  Alarms On Y  VAP Prevention  Transported while on vent Yes  HOB> 30 Degrees Y  Breath Sounds  Bilateral Breath Sounds Rhonchi  Airway Suctioning/Secretions  Suction Type Tracheal  Suction Device  Catheter  Secretion Amount Moderate  Secretion Color Tan  Secretion Consistency Thick  Suction Tolerance Tolerated well  Suctioning Adverse Effects None

## 2016-09-21 NOTE — Procedures (Signed)
GJ tip is in the proximal duodenum The bumper was tightened in place and secured. EBL 0 Comp 0 GJ ready for use.

## 2016-09-21 NOTE — Progress Notes (Signed)
eLink Physician-Brief Progress Note Patient Name: Sean Palmer DOB: 1991-03-02 MRN: [id number]   Date of Service  09/21/2016  HPI/Events of Note  Multiple issues: 1. Anemia - Hgb = 6.7 and 2. K+ = 2.9 and Creatinine = 0.40.   eICU Interventions  Will order: 1. Transfuse 1 unit PRBC. 2. Replace K+. 3. Repeat BMP at 2 PM.     Intervention Category Major Interventions: Electrolyte abnormality - evaluation and management;Other:  Lenell AntuSommer,Steven Eugene 09/21/2016, 6:17 AM

## 2016-09-21 NOTE — Progress Notes (Signed)
eLink Physician-Brief Progress Note Patient Name: Sean Palmer DOB: Dec 15, 1990 MRN: [id number]   Date of Service  09/21/2016  HPI/Events of Note  Persistent hypotension despite IV fluid bolus. Heart rate still normal without any beta blocker or calcium channel blocker blockade. Type and screen obtained this morning.   eICU Interventions  1. Checking stat portable chest x-ray given central venous catheter removal today. 2. Checking stat coags and CBC 3. NP to come to bedside & assess patient     Intervention Category Major Interventions: Hypotension - evaluation and management  Lawanda CousinsJennings Nijah Orlich 09/21/2016, 6:33 PM

## 2016-09-21 NOTE — Progress Notes (Signed)
One liter bolus up for hypotension, SBP 70-80s since arrival to stepdown post IR for Gtube repositioning.  CCM notified and aware.

## 2016-09-21 NOTE — Progress Notes (Signed)
eLink Physician-Brief Progress Note Patient Name: Sean Palmer DOB: Mar 29, 1991 MRN: [id number]   Date of Service  09/21/2016  HPI/Events of Note  Multiple issues" 1. Blood glucose = 47. Tube feeds on hold d/t leaking PEG, oliguria and BP = 99/61.  eICU Interventions  Will order: 1. D5W to run IV at 50 mL/hour. 2. Monitor CVP. 3. Bolus with 0.9 NaCl 1 liter IV over 1 hour now.      Intervention Category Major Interventions: Electrolyte abnormality - evaluation and management Intermediate Interventions: Other:;Oliguria - evaluation and management  Sommer,Steven Dennard Nipugene 09/21/2016, 4:47 AM

## 2016-09-21 NOTE — Progress Notes (Signed)
CCM notified again re: hypotension, SBP 80s, even after one liter bolus. New orders received to give a second liter IV fluid bolus and NP will come to bedside to assess patient.

## 2016-09-21 NOTE — Care Management Note (Signed)
Case Management Note  Patient Details  Name: Sean Palmer MRN: [id number] Date of Birth: 1991/04/13  Subjective/Objective:   Pt admitted with acute resp failure                 Action/Plan:   PTA from [facility] - per attending pt will be likely ready to discharge back to SNF within 24 -48 hours  - CSW made aware   Expected Discharge Date:                  Expected Discharge Plan:  Skilled Nursing Facility  In-House Referral:  Clinical Social Work  Discharge planning Services  CM Consult  Post Acute Care Choice:    Choice offered to:     DME Arranged:    DME Agency:     HH Arranged:    HH Agency:     Status of Service:     If discussed at [employer] Meetings, dates discussed:    Additional Comments: CM spoke with attending - plan is for pt to transfer to [place] [facility] Cherylann ParrClaxton, Temitope Flammer S, RN 09/21/2016, 11:36 AM

## 2016-09-21 NOTE — Progress Notes (Signed)
Hypoglycemic Event  CBG: 69   Treatment: D50 IV 25 mL  Symptoms: Sweaty  Follow-up CBG: Time:0425 CBG Result:126  Possible Reasons for Event: Inadequate meal intake  Comments/MD notified:S.Sommer, added D5 @50  maintenance fluids    Sean Palmer [name]

## 2016-09-21 NOTE — Progress Notes (Signed)
eLink Physician-Brief Progress Note Patient Name: Sean Palmer DOB: Mar 12, 1991 MRN: [id number]   Date of Service  09/21/2016  HPI/Events of Note  Nurse reports patient hypotensive upon arrival to stepdown unit from interventional radiology. Medicine reconciliation reviewed without obvious sedation administration. Patient did receive a total of 1.5 L bolus earlier today. Not currently tachycardic. Borderline hypothermia but not febrile. Documented stool occurrence 1 yesterday. Nurse of reports appropriate cuff size is being utilized and blood pressure confirmed in bilateral upper extremities.   eICU Interventions  Normal saline 1 L IV fluid bolus now      Intervention Category Major Interventions: Hypotension - evaluation and management  Lawanda CousinsJennings Krishay Faro 09/21/2016, 4:49 PM

## 2016-09-21 NOTE — Progress Notes (Addendum)
S:  Called to bedside to assess patient for hypotension. Patient arrived from IR after adjustment of PEG tube, did not require sedation. Now systolic 70-80s.    O:  Blood pressure (!) 80/44, pulse 85, temperature (!) 96.1 F (35.6 C), temperature source Rectal, resp. rate (!) 26, height 5\' 8"  (1.727 m), weight 52.6 kg (115 lb 15.4 oz), SpO2 97 %.   General:Adult male no distress Neuro: Alert, does not follow commands CV: RRR, no mrg PULM: Clear breath sounds, no wheeze, no vent  GI: non-tender, PEG in place  Extremities: warm, dry, no edema    CBC    Component Value Date/Time   WBC 5.8 09/21/2016 0420   RBC 2.57 (L) 09/21/2016 0420   HGB 7.4 (L) 09/21/2016 1354   HCT 26.3 (L) 09/21/2016 1354   PLT 213 09/21/2016 0420   MCV 96.1 09/21/2016 0420   MCH 26.1 09/21/2016 0420   MCHC 27.1 (L) 09/21/2016 0420   RDW 16.7 (H) 09/21/2016 0420   LYMPHSABS 0.5 (L) 09/20/2016 0435   MONOABS 0.4 09/20/2016 0435   EOSABS 0.2 09/20/2016 0435   BASOSABS 0.0 09/20/2016 0435    BMET    Component Value Date/Time   NA 153 (H) 09/21/2016 1354   K 5.1 09/21/2016 1354   CL 113 (H) 09/21/2016 1354   CO2 37 (H) 09/21/2016 1354   GLUCOSE 91 09/21/2016 1354   BUN 20 09/21/2016 1354   CREATININE 0.30 (L) 09/21/2016 1354   CALCIUM 7.9 (L) 09/21/2016 1354   GFRNONAA >60 09/21/2016 1354   GFRAA >60 09/21/2016 1354      A:  Hypotension post-procedure   P: -Cardiac Monitoring  -Maintain MAP >65 (if no improvement will need pressor and transfer to ICU, MAP stable now) -500 cc bolus given > systolic now improving 86/54 (lookng back patient systolic 80-90 overnight as well) -CBC and Coags to assess for bleeding post-procedure (external exam no bleeding noted) -CXR pending    Jovita KussmaulKatalina Naje Rice, AGACNP-BC [facility]  Pgr: [phone]  PCCM Pgr: [phone]

## 2016-09-21 NOTE — Progress Notes (Signed)
Patient's father, Sean Palmer, Sean Palmer. Called in for patient update.  Expressed concerns over patient's nutritional status (has lost 19# since last admission in October, 2017); concern over Gtube and tube feedings; also requests to speak w/Sarah Bullard, NP for Palliative Care.  Father states he had developed a good rapport w/Sarah during last admission and he wants to discuss plan of care w/her as soon as possible - asks to speak w/Sarah personally.  Note left on the chart along w/cell phone number for father.

## 2016-09-21 NOTE — Progress Notes (Signed)
Pharmacy Antibiotic Note  Sean Palmer is a 26 y.o. male admitted on 09/19/2016 with sepsis.  Pharmacy has been consulted for vancomycin dosing. Patient has worsening hypothermia. 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 Spoke with Dr. Jamison NeighborNestor and will monitor and slow down rate of infusion.  Plan: Vancomycin 750mg  IV every 12 hours.  Goal trough 15-20 mcg/mL.  Cefepime 2g IV q12h Monitor culture data, renal function and clinical course VT at SS prn  Height: 5\' 8"  (172.7 cm) Weight: 115 lb 15.4 oz (52.6 kg) IBW/kg (Calculated) : 68.4  Temp (24hrs), Avg:95 F (35 C), Min:92.7 F (33.7 C), Max:98.7 F (37.1 C)   Recent Labs Lab 09/19/16 0250 09/19/16 0303 09/19/16 0824 09/20/16 0435 09/21/16 0420 09/21/16 1354 09/21/16 1858  WBC 29.7*  --  17.9* 9.0 5.8  --  6.2  CREATININE 1.05  --  0.62 0.34* 0.40* 0.30*  --   LATICACIDVEN  --  1.24  --   --   --   --   --     Estimated Creatinine Clearance: 105.9 mL/min (A) (by C-G formula based on SCr of 0.3 mg/dL (L)).    Allergies  Allergen Reactions  . Vancomycin Other (See Comments)    redmans syndrome  . Piperacillin Other (See Comments)    Resistant per [facility] paperwork     Arlean HoppingCorey M. Newman PiesBall, PharmD, BCPS Clinical Pharmacist [phone] 09/21/2016 9:47 PM

## 2016-09-21 NOTE — Progress Notes (Signed)
CPT not done at this 1600 due to patient going to IR and being transported to 4NP. Will pass treatment along in report to new RT

## 2016-09-21 NOTE — Progress Notes (Signed)
eLink Physician-Brief Progress Note Patient Name: Joseph ArtFrederick G Villatoro DOB: Apr 30, 1990 MRN: [id number]   Date of Service  09/21/2016  HPI/Events of Note  Patient with worsening hypothermia. Bear hugger now in place.   eICU Interventions  1. Transferring patient to ICU 2. Ordering low-dose peripheral Neo-Synephrine 3. Trending Procalcitonin per algorithm 4. Continuing cefepime & adding vancomycin for empiric coverage 5. Repeating blood, tracheal aspirate, and urine cultures with urinalysis      Intervention Category Major Interventions: Shock - evaluation and management  Lawanda CousinsJennings Pessy Delamar 09/21/2016, 9:35 PM

## 2016-09-22 ENCOUNTER — Inpatient Hospital Stay (HOSPITAL_COMMUNITY): Payer: Medicare Other

## 2016-09-22 DIAGNOSIS — R6521 Severe sepsis with septic shock: Secondary | ICD-10-CM | POA: Diagnosis not present

## 2016-09-22 DIAGNOSIS — L89153 Pressure ulcer of sacral region, stage 3: Secondary | ICD-10-CM | POA: Diagnosis not present

## 2016-09-22 DIAGNOSIS — J9601 Acute respiratory failure with hypoxia: Secondary | ICD-10-CM | POA: Diagnosis not present

## 2016-09-22 DIAGNOSIS — A419 Sepsis, unspecified organism: Secondary | ICD-10-CM | POA: Diagnosis not present

## 2016-09-22 DIAGNOSIS — J9611 Chronic respiratory failure with hypoxia: Secondary | ICD-10-CM | POA: Diagnosis not present

## 2016-09-22 DIAGNOSIS — R0602 Shortness of breath: Secondary | ICD-10-CM | POA: Diagnosis not present

## 2016-09-22 DIAGNOSIS — Z515 Encounter for palliative care: Secondary | ICD-10-CM | POA: Diagnosis not present

## 2016-09-22 DIAGNOSIS — J9622 Acute and chronic respiratory failure with hypercapnia: Secondary | ICD-10-CM | POA: Diagnosis not present

## 2016-09-22 DIAGNOSIS — L8944 Pressure ulcer of contiguous site of back, buttock and hip, stage 4: Secondary | ICD-10-CM

## 2016-09-22 LAB — URINALYSIS, ROUTINE W REFLEX MICROSCOPIC
BILIRUBIN URINE: NEGATIVE
Bilirubin Urine: NEGATIVE
GLUCOSE, UA: NEGATIVE mg/dL
Glucose, UA: >=500 mg/dL — AB
Ketones, ur: 5 mg/dL — AB
Ketones, ur: NEGATIVE mg/dL
Leukocytes, UA: NEGATIVE
NITRITE: NEGATIVE
NITRITE: NEGATIVE
PROTEIN: 100 mg/dL — AB
Protein, ur: 30 mg/dL — AB
SPECIFIC GRAVITY, URINE: 1.021 (ref 1.005–1.030)
Specific Gravity, Urine: 1.015 (ref 1.005–1.030)
Squamous Epithelial / LPF: NONE SEEN
pH: 5 (ref 5.0–8.0)
pH: 5 (ref 5.0–8.0)

## 2016-09-22 LAB — GLUCOSE, CAPILLARY
GLUCOSE-CAPILLARY: 160 mg/dL — AB (ref 65–99)
GLUCOSE-CAPILLARY: 197 mg/dL — AB (ref 65–99)
GLUCOSE-CAPILLARY: 231 mg/dL — AB (ref 65–99)
GLUCOSE-CAPILLARY: 81 mg/dL (ref 65–99)
Glucose-Capillary: 96 mg/dL (ref 65–99)
Glucose-Capillary: 89 mg/dL (ref 65–99)
Glucose-Capillary: 127 mg/dL — ABNORMAL HIGH (ref 65–99)

## 2016-09-22 LAB — BASIC METABOLIC PANEL
ANION GAP: 5 (ref 5–15)
Anion gap: 3 — ABNORMAL LOW (ref 5–15)
BUN: 17 mg/dL (ref 6–20)
BUN: 12 mg/dL (ref 6–20)
CALCIUM: 8.1 mg/dL — AB (ref 8.9–10.3)
CALCIUM: 7.2 mg/dL — AB (ref 8.9–10.3)
CO2: 34 mmol/L — ABNORMAL HIGH (ref 22–32)
CO2: 30 mmol/L (ref 22–32)
CREATININE: 0.41 mg/dL — AB (ref 0.61–1.24)
Chloride: 116 mmol/L — ABNORMAL HIGH (ref 101–111)
Chloride: 108 mmol/L (ref 101–111)
Creatinine, Ser: <0.3 mg/dL — ABNORMAL LOW (ref 0.61–1.24)
GFR calc Af Amer: >60 mL/min (ref >60)
GLUCOSE: 114 mg/dL — AB (ref 65–99)
GLUCOSE: 438 mg/dL — AB (ref 65–99)
POTASSIUM: 3.6 mmol/L (ref 3.5–5.1)
Potassium: 3.4 mmol/L — ABNORMAL LOW (ref 3.5–5.1)
SODIUM: 141 mmol/L (ref 135–145)
Sodium: 155 mmol/L — ABNORMAL HIGH (ref 135–145)

## 2016-09-22 LAB — CBC WITH DIFFERENTIAL/PLATELET
BASOS ABS: 0 10*3/uL (ref 0.0–0.1)
BASOS PCT: 0 %
Eosinophils Absolute: 0.4 10*3/uL (ref 0.0–0.7)
Eosinophils Relative: 3 %
HEMATOCRIT: 29.1 % — AB (ref 39.0–52.0)
HEMOGLOBIN: 8.2 g/dL — AB (ref 13.0–17.0)
LYMPHS PCT: 10 %
Lymphs Abs: 1.4 10*3/uL (ref 0.7–4.0)
MCH: 26.6 pg (ref 26.0–34.0)
MCHC: 28.2 g/dL — ABNORMAL LOW (ref 30.0–36.0)
MCV: 94.5 fL (ref 78.0–100.0)
MONO ABS: 0.9 10*3/uL (ref 0.1–1.0)
Monocytes Relative: 6 %
NEUTROS ABS: 11.7 10*3/uL — AB (ref 1.7–7.7)
NEUTROS PCT: 81 %
Platelets: 253 10*3/uL (ref 150–400)
RBC: 3.08 MIL/uL — ABNORMAL LOW (ref 4.22–5.81)
RDW: 17.2 % — AB (ref 11.5–15.5)
WBC: 14.4 10*3/uL — ABNORMAL HIGH (ref 4.0–10.5)

## 2016-09-22 LAB — CULTURE, RESPIRATORY W GRAM STAIN

## 2016-09-22 LAB — POCT I-STAT 3, ART BLOOD GAS (G3+)
Acid-Base Excess: 8 mmol/L — ABNORMAL HIGH (ref 0.0–2.0)
Acid-Base Excess: 7 mmol/L — ABNORMAL HIGH (ref 0.0–2.0)
BICARBONATE: 32.1 mmol/L — AB (ref 20.0–28.0)
BICARBONATE: 33.7 mmol/L — AB (ref 20.0–28.0)
O2 Saturation: 85 %
O2 Saturation: 86 %
PCO2 ART: 67 mmHg — AB (ref 32.0–48.0)
PO2 ART: 47 mmHg — AB (ref 83.0–108.0)
PO2 ART: 60 mmHg — AB (ref 83.0–108.0)
Patient temperature: 100.1
Patient temperature: 100.1
TCO2: 33 mmol/L (ref 0–100)
TCO2: 36 mmol/L (ref 0–100)
pCO2 arterial: 41.8 mmHg (ref 32.0–48.0)
pH, Arterial: 7.496 — ABNORMAL HIGH (ref 7.350–7.450)
pH, Arterial: 7.314 — ABNORMAL LOW (ref 7.350–7.450)

## 2016-09-22 LAB — PROCALCITONIN
PROCALCITONIN: 1.1 ng/mL
Procalcitonin: 0.78 ng/mL

## 2016-09-22 LAB — CULTURE, RESPIRATORY

## 2016-09-22 LAB — LACTIC ACID, PLASMA: Lactic Acid, Venous: 0.7 mmol/L (ref 0.5–1.9)

## 2016-09-22 LAB — TROPONIN I
Troponin I: 0.03 ng/mL (ref <0.03)
Troponin I: <0.03 ng/mL (ref <0.03)

## 2016-09-22 MED ORDER — SODIUM CHLORIDE 0.9 % IV SOLN
1.0000 g | Freq: Three times a day (TID) | INTRAVENOUS | Status: DC
  Administered 2016-09-22 – 2016-09-28 (×19): 1 g via INTRAVENOUS
  Filled 2016-09-22 (×20): qty 1

## 2016-09-22 MED ORDER — FENTANYL 2500MCG IN NS 250ML (10MCG/ML) PREMIX INFUSION
25.0000 ug/h | INTRAVENOUS | Status: DC
  Administered 2016-09-22: 100 ug/h via INTRAVENOUS
  Administered 2016-09-22 – 2016-09-24 (×5): 300 ug/h via INTRAVENOUS
  Filled 2016-09-22 (×6): qty 250

## 2016-09-22 MED ORDER — SODIUM CHLORIDE 0.9 % IV SOLN
200.0000 mg | Freq: Once | INTRAVENOUS | Status: AC
  Administered 2016-09-22: 200 mg via INTRAVENOUS
  Filled 2016-09-22: qty 200

## 2016-09-22 MED ORDER — NOREPINEPHRINE BITARTRATE 1 MG/ML IV SOLN
0.0000 ug/min | INTRAVENOUS | Status: DC
  Administered 2016-09-22: 22 ug/min via INTRAVENOUS
  Administered 2016-09-22: 10 ug/min via INTRAVENOUS
  Filled 2016-09-22 (×2): qty 4

## 2016-09-22 MED ORDER — FENTANYL CITRATE (PF) 100 MCG/2ML IJ SOLN
50.0000 ug | Freq: Once | INTRAMUSCULAR | Status: AC
  Administered 2016-09-22: 50 ug via INTRAVENOUS
  Filled 2016-09-22: qty 2

## 2016-09-22 MED ORDER — SODIUM CHLORIDE 0.9% FLUSH: 10.0000 mL | INTRAVENOUS | Status: DC | PRN

## 2016-09-22 MED ORDER — ALBUTEROL SULFATE (2.5 MG/3ML) 0.083% IN NEBU
2.5000 mg | INHALATION_SOLUTION | RESPIRATORY_TRACT | Status: DC
  Administered 2016-09-22 – 2016-09-28 (×38): 2.5 mg via RESPIRATORY_TRACT
  Filled 2016-09-22 (×39): qty 3

## 2016-09-22 MED ORDER — DIPHENHYDRAMINE HCL 12.5 MG/5ML PO ELIX
25.0000 mg | ORAL_SOLUTION | Freq: Four times a day (QID) | ORAL | Status: DC | PRN
  Administered 2016-09-22: 25 mg
  Filled 2016-09-22 (×2): qty 10

## 2016-09-22 MED ORDER — FREE WATER
200.0000 mL | Status: DC
  Administered 2016-09-22 – 2016-09-25 (×19): 200 mL

## 2016-09-22 MED ORDER — CHLORHEXIDINE GLUCONATE CLOTH 2 % EX PADS
6.0000 | MEDICATED_PAD | Freq: Every day | CUTANEOUS | Status: DC
  Administered 2016-09-22 – 2016-09-28 (×7): 6 via TOPICAL

## 2016-09-22 MED ORDER — FENTANYL BOLUS VIA INFUSION
50.0000 ug | INTRAVENOUS | Status: DC | PRN
  Filled 2016-09-22: qty 50

## 2016-09-22 MED ORDER — SODIUM CHLORIDE 0.9 % IV SOLN
100.0000 mg | INTRAVENOUS | Status: AC
  Administered 2016-09-23 – 2016-09-28 (×6): 100 mg via INTRAVENOUS
  Filled 2016-09-22 (×6): qty 100

## 2016-09-22 MED ORDER — DEXTROSE 5 % IV SOLN
0.0000 ug/min | INTRAVENOUS | Status: DC
  Administered 2016-09-22: 22 ug/min via INTRAVENOUS
  Filled 2016-09-22 (×2): qty 16
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
## 2016-09-22 NOTE — Procedures (Signed)
PCCM Video Bronchoscopy Procedure Note  The patient was informed of the risks (including but not limited to bleeding, infection, respiratory failure, lung injury, tooth/oral injury) and benefits of the procedure and gave consent, see chart.  The patient's father gave verbal consent in our phone conversation this morning.  Indication: Respiratory failure, sepsis  Post Procedure Diagnosis: same  [facility]: [facility] Room [phone]  Condition pre procedure: critically ill, on vent  Medications for procedure:   Procedure description: The bronchoscope was introduced through the tracheostomy and passed to the bilateral lungs to the level of the subsegmental bronchi throughout the tracheobronchial tree.  Airway exam revealed some grey secretions in the left tracheobronchial tree which were easily suctioned.  Frothy yellow to grey secretions in the right lung also easily suctioned.  Procedures performed: BAL RML > 20cc sterile saline injected, 10cc cloudy return, non-bloody  Specimens sent: bacterial culture BAL  Condition post procedure: stable on vent  EBL: none from procedure  Complications: none immediate  Roselie Awkward, MD [place] PCCM Pager: [phone] Cell: [phone] After 3pm or if no response, call [phone]
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
## 2016-09-22 NOTE — Progress Notes (Signed)
eLink Physician-Brief Progress Note Patient Name: Sean Palmer DOB: Dec 06, 1990 MRN: [id number]   Date of Service  09/22/2016  HPI/Events of Note  Multiple issues: 1. Hypotension and 2. Likely atelectasis of L lung.   eICU Interventions  Will order: 1. Increase ceiling on Phenylephrine IV infusion.  2. Increase PEEP to 8. 3. ABG now. 4. Albuterol Nebs now and Q 2 hours. 5. Chest PT and PD now and  Q 4 hours.     Intervention Category Major Interventions: Hypotension - evaluation and management  Sean Palmer 09/22/2016, 5:35 AM

## 2016-09-22 NOTE — Progress Notes (Signed)
Pts need for Neo- Synephrine increasing. Pt now on maximum dose, increased tachypnea and decrease sats. MD notified. Orders placed. Will continue to monitor.

## 2016-09-22 NOTE — Progress Notes (Signed)
Patient remains tachycardic after starting Fentanyl drip. Per NP, no beta blockers at this time. RN will continue to monitor.

## 2016-09-22 NOTE — Progress Notes (Signed)
Peripherally Inserted Central Catheter/Midline Placement  The IV Nurse has discussed with the patient and/or persons authorized to consent for the patient, the purpose of this procedure and the potential benefits and risks involved with this procedure.  The benefits include less needle sticks, lab draws from the catheter, and the patient may be discharged home with the catheter. Risks include, but not limited to, infection, bleeding, blood clot (thrombus formation), and puncture of an artery; nerve damage and irregular heartbeat and possibility to perform a PICC exchange if needed/ordered by physician.  Alternatives to this procedure were also discussed.  Bard Power PICC patient education guide, fact sheet on infection prevention and patient information card has been provided to patient /or left at bedside.    PICC/Midline Placement Documentation  PICC Single Lumen 07/29/16 PICC Left (Active)     PICC Double Lumen 09/22/16 PICC Left Basilic 40 cm 0 cm (Active)  Indication for Insertion or Continuance of Line Limited venous access - need for IV therapy >5 days (PICC only) 09/22/2016 11:00 AM  Exposed Catheter (cm) 0 cm 09/22/2016 11:00 AM  Dressing Change Due 09/29/16 09/22/2016 11:00 AM   Sean Palmer received telephone consent from this patients father.    Consuello Masseimmons, Katharyn Schauer M 09/22/2016, 11:13 AM

## 2016-09-22 NOTE — Progress Notes (Signed)
[facility] PCCM Attending:  I have seen and examined the patient with nurse practitioner/resident and agree with the note above.  We formulated the plan together and I elicited the following history.    [name] was brought back to the ICU with hypotension, rising temperature, and worsening oxygenation and respiratory secretions.  Overnight he has required increasing vent support.  On exam Vitals:   09/22/16 0700 09/22/16 0735 09/22/16 0800 09/22/16 0842  BP: (!) 111/56 (!) 111/58 (!) 104/48   Pulse: (!) 108 (!) 111 (!) 123   Resp: (!) 26 (!) 29 (!) 26   Temp:    100 F (37.8 C)  TempSrc:    Rectal  SpO2: 94% 99% 98%   Weight:      Height:       Vent Mode: PRVC FiO2 (%):  [40 %-60 %] 60 % Set Rate:  [26 bmp] 26 bmp Vt Set:  [480 mL] 480 mL PEEP:  [5 cmH20-8 cmH20] 8 cmH20 Plateau Pressure:  [24 cmH20-32 cmH20] 30 cmH20  Gen: critically ill appearing on vent HENT: NCAT Trach in place PULM: Rhonchi on right, persistent wheezing on left CV: Tachycardia, regular rhythm GI: BS+, soft, PEG in place Derm: flushing of face, diaphoresis Neuro: turns head with suctioning  CXR: independently reviewed> no change in left lung white out  Acute on chronic respiratory failure with hypoxemia and septic shock> clinical picture overnight most consistent with an acute respiratory infection given change in oxygenation and shock; plan  continue MRSA coverage,  broaden gram negative coverage to carbapenem,  add antifungal coverage,  continue full vent support,  bronchoscopy today for airway clearance and obtain lower airway sample,  place central access again,  continue IVF Continue hydrocortisone  Hypernatremia > increase D5 and free water > monitor sodium closely, don't allow Na to drop more than 8-12 mEq in 24 days  Sacral wound, goes to bone > continue wound care > may need CT/MRI imaging to assess for osteo/underlying abscess  I update father by phone this morning  My cc time 40  minutes  Heber CarolinaBrent Segundo Makela, MD [place] PCCM Pager: [phone] Cell: [phone] After 3pm or if no response, call [phone]

## 2016-09-22 NOTE — Progress Notes (Addendum)
Upon assessment, patient shows increased work of breathing and nasal flaring. SPO2 stable at this time. HR in 120's. NP notified and to bedside. RN will continue to monitor.

## 2016-09-22 NOTE — Procedures (Addendum)
Bedside Bronchoscopy Procedure Note Sean Palmer [id number] 1991/04/06  Procedure: Bronchoscopy Indications: Obtain specimens for culture and/or other diagnostic studies and Remove secretions  Procedure Details: ET Tube Size: ET Tube secured at lip (cm): Bite block in place: No In preparation for procedure, Patient hyper-oxygenated with 100 % FiO2 Airway entered and the following bronchi were examined: RUL, RML, RLL, LUL and LLL.   Bronchoscope removed.  , Patient placed back on 100% FiO2 at conclusion of procedure.    Evaluation BP (!) 117/52   Pulse (!) 109   Temp 100 F (37.8 C) (Rectal)   Resp (!) 26   Ht _0  (1.727 m)   Wt 122 lb 2.2 oz (55.4 kg)   SpO2 100%   BMI 18.57 kg/m  Breath Sounds:Coarse crackles O2 sats: stable throughout Patient's Current Condition: stable Specimens:  Sent BAL  Complications: No apparent complications Patient did tolerate procedure well.   [name] M 09/22/2016, 11:59 AM

## 2016-09-22 NOTE — Progress Notes (Addendum)
Patient tachycardic in the 130's. Fentanyl and Versed given with no relief. Increased work of breathing noted, sats 88. RT and MD notified. FiO2 adjusted by RT & chest xray ordered.

## 2016-09-22 NOTE — Progress Notes (Signed)
eLink Physician-Brief Progress Note Patient Name: Sean Palmer DOB: [birth year] MRN: [id number]   Date of Service  09/22/2016  HPI/Events of Note  ABG on 60%/PRVC 26/TV 480/P 8 = 7.31/67/60/33.  eICU Interventions  Continue present ventilator management.      Intervention Category Major Interventions: Respiratory failure - evaluation and management  Mahmood Boehringer Eugene 09/22/2016, 6:43 AM

## 2016-09-22 NOTE — Progress Notes (Signed)
eLink Physician-Brief Progress Note Patient Name: Sean Palmer DOB: 1990/10/07 MRN: [id number]   Date of Service  09/22/2016  HPI/Events of Note  Blood sugar 231 Pt is on D5 at 100 and tube feeds  eICU Interventions   Stop D5. Continue to monitor sugars     Intervention Category Intermediate Interventions: Hyperglycemia - evaluation and treatment  Elzada Pytel 09/22/2016, 4:56 PM

## 2016-09-23 ENCOUNTER — Inpatient Hospital Stay (HOSPITAL_COMMUNITY): Payer: Medicare Other

## 2016-09-23 DIAGNOSIS — E876 Hypokalemia: Secondary | ICD-10-CM | POA: Diagnosis not present

## 2016-09-23 DIAGNOSIS — J189 Pneumonia, unspecified organism: Secondary | ICD-10-CM | POA: Diagnosis not present

## 2016-09-23 DIAGNOSIS — A419 Sepsis, unspecified organism: Secondary | ICD-10-CM | POA: Diagnosis not present

## 2016-09-23 DIAGNOSIS — J9601 Acute respiratory failure with hypoxia: Secondary | ICD-10-CM | POA: Diagnosis not present

## 2016-09-23 LAB — GLUCOSE, CAPILLARY
GLUCOSE-CAPILLARY: 140 mg/dL — AB (ref 65–99)
GLUCOSE-CAPILLARY: 149 mg/dL — AB (ref 65–99)
GLUCOSE-CAPILLARY: 155 mg/dL — AB (ref 65–99)
GLUCOSE-CAPILLARY: 165 mg/dL — AB (ref 65–99)
GLUCOSE-CAPILLARY: 176 mg/dL — AB (ref 65–99)
Glucose-Capillary: 161 mg/dL — ABNORMAL HIGH (ref 65–99)
Glucose-Capillary: 177 mg/dL — ABNORMAL HIGH (ref 65–99)

## 2016-09-23 LAB — CBC WITH DIFFERENTIAL/PLATELET
BASOS ABS: 0 10*3/uL (ref 0.0–0.1)
Basophils Relative: 0 %
Eosinophils Absolute: 0 10*3/uL (ref 0.0–0.7)
Eosinophils Relative: 0 %
HEMATOCRIT: 28.1 % — AB (ref 39.0–52.0)
HEMOGLOBIN: 7.7 g/dL — AB (ref 13.0–17.0)
Lymphocytes Relative: 6 %
Lymphs Abs: 1.1 10*3/uL (ref 0.7–4.0)
MCH: 26.4 pg (ref 26.0–34.0)
MCHC: 27.4 g/dL — ABNORMAL LOW (ref 30.0–36.0)
MCV: 96.2 fL (ref 78.0–100.0)
MONO ABS: 0.9 10*3/uL (ref 0.1–1.0)
Monocytes Relative: 5 %
NEUTROS ABS: 16.4 10*3/uL — AB (ref 1.7–7.7)
NEUTROS PCT: 89 %
Platelets: 229 10*3/uL (ref 150–400)
RBC: 2.92 MIL/uL — ABNORMAL LOW (ref 4.22–5.81)
RDW: 17.3 % — AB (ref 11.5–15.5)
WBC: 18.4 10*3/uL — ABNORMAL HIGH (ref 4.0–10.5)

## 2016-09-23 LAB — BASIC METABOLIC PANEL
Anion gap: 6 (ref 5–15)
BUN: 14 mg/dL (ref 6–20)
CO2: 32 mmol/L (ref 22–32)
CREATININE: 0.35 mg/dL — AB (ref 0.61–1.24)
Calcium: 7.7 mg/dL — ABNORMAL LOW (ref 8.9–10.3)
Chloride: 109 mmol/L (ref 101–111)
Glucose, Bld: 176 mg/dL — ABNORMAL HIGH (ref 65–99)
Potassium: 3.5 mmol/L (ref 3.5–5.1)
SODIUM: 147 mmol/L — AB (ref 135–145)

## 2016-09-23 LAB — URINE CULTURE

## 2016-09-23 LAB — PROCALCITONIN: PROCALCITONIN: 1.07 ng/mL

## 2016-09-23 MED ORDER — NOREPINEPHRINE BITARTRATE 1 MG/ML IV SOLN
0.0000 ug/min | INTRAVENOUS | Status: DC
  Administered 2016-09-23: 6 ug/min via INTRAVENOUS
  Administered 2016-09-25: 2 ug/min via INTRAVENOUS
  Filled 2016-09-23 (×2): qty 16

## 2016-09-23 MED ORDER — DEXTROSE 5 % IV SOLN
0.0000 ug/min | INTRAVENOUS | Status: DC
  Filled 2016-09-23: qty 4

## 2016-09-23 NOTE — Progress Notes (Signed)
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    SUBJECTIVE:  Unresponsive  Desaturates if on left side   VITAL SIGNS: BP 130/73   Pulse (!) 122   Temp 98.9 F (37.2 C) (Oral)   Resp 16   Ht 5\' 8"  (1.727 m)   Wt 127 lb 3.3 oz (57.7 kg)   SpO2 (!) 89%   BMI 19.34 kg/m   HEMODYNAMICS:  VENTILATOR SETTINGS: Vent Mode: PRVC FiO2 (%):  [50 %] 50 % Set Rate:  [26 bmp] 26 bmp Vt Set:  [480 mL] 480 mL PEEP:  [8 cmH20] 8 cmH20 Plateau Pressure:  [29 cmH20-38 cmH20] 30 cmH20  INTAKE / OUTPUT: I/O last 3 completed shifts: In: 8370.6 [I.V.:5110.9; Other:200; NG/GT:2049.7; IV Piggyback:1010] Out: 2325 [Urine:2325]  PHYSICAL EXAMINATION: General appearance:  26 Year old  Male, unresponsive. Diaphoretic  Eyes: anicteric sclerae, moist conjunctivae; PERRL, EOMI bilaterally. Mouth:  membranes and no mucosal ulcerations; normal hard and soft palate Neck: Trachea midline; neck supple, no JVD  # 7 trach  Lungs/chest: decreased on left, scattered rhonchi CV: RRR, no MRGs  Abdomen: Soft, non-tender; no masses or HSM Extremities: No peripheral edema or extremity lymphadenopathy Skin: Normal temperature, turgor and texture; no rash, ulcers or subcutaneous nodules Neuro/Psych: contracted. Not responsive.    LABS:  BMET  Recent Labs Lab 09/22/16 0321 09/22/16 1820 09/23/16 0430  NA 155* 141 147*  K 3.6 3.4* 3.5  CL 116* 108 109  CO2 34* 30 32  BUN 17 12 14   CREATININE <0.30* 0.41* 0.35*  GLUCOSE 114* 438* 176*    Electrolytes  Recent Labs Lab 09/19/16 1712 09/20/16 0435  09/20/16 1625  09/21/16 0930  09/22/16 0321 09/22/16 1820 09/23/16 0430  CALCIUM  --  8.3*  --   < >  --   < > 8.1* 7.2* 7.7*  MG 2.7* 2.5* 2.2  --   --   --   --   --   --   PHOS 1.6* 1.9* 2.6  --  2.9  --   --   --   --   < > = values in this interval not displayed.  CBC  Recent Labs Lab 09/21/16 1858 09/22/16 0321 09/23/16 0430  WBC 6.2 14.4* 18.4*  HGB 7.8* 8.2* 7.7*  HCT 27.9* 29.1* 28.1*  PLT 165 253 229    Coag's  Recent Labs Lab 09/21/16 1858  APTT 61*  INR 1.47    Sepsis Markers  Recent Labs Lab 09/19/16 0303 09/21/16 2103 09/22/16 0004 09/22/16 0850 09/22/16 0859 09/23/16 0430  LATICACIDVEN 1.24 0.5  --   --  0.7  --   PROCALCITON  --   --  1.10 0.78  --  1.07    ABG  Recent Labs Lab 09/21/16 2110 09/22/16 0625 09/22/16 0638  PHART 7.410 7.496* 7.314*  PCO2ART 52.7* 41.8 67.0*  PO2ART 134* 47.0* 60.0*    Liver Enzymes  Recent Labs Lab 09/19/16 0250  AST 18  ALT 18  ALKPHOS 124  BILITOT 1.0  ALBUMIN 2.5*    Cardiac Enzymes  Recent Labs Lab 09/21/16  2103 09/22/16 0321 09/22/16 0850  TROPONINI <0.03 0.03* <0.03    Glucose  Recent Labs Lab 09/22/16 1552 09/22/16 2117 09/23/16 0011 09/23/16 0417 09/23/16 0831 09/23/16 1207  GLUCAP 231* 160* 177* 155* 176* 161*    Imaging Dg Chest Port 1 View  Result Date: 09/23/2016 CLINICAL DATA:  Respiratory failure on ventilator. EXAM: PORTABLE CHEST 1 VIEW COMPARISON:  09/22/2016 and prior exams FINDINGS: A tracheostomy tube and left PICC line with tip overlying the superior cavoatrial junction noted. Left lung opacification, volume loss and bronchiectasis again noted. New right lower lung opacity is suspicious for airspace disease. There is no evidence of pneumothorax. IMPRESSION: New right lower lung opacity suspicious for airspace disease/pneumonia. Otherwise unchanged appearance of the chest with left lung opacification, volume loss and bronchiectasis. Electronically Signed    By: Harmon PierJeffrey  Hu M.D.   On: 09/23/2016 07:41   Dg Chest Port 1 View  Result Date: 09/22/2016 CLINICAL DATA:  Patient with acute respiratory failure. EXAM: PORTABLE CHEST 1 VIEW COMPARISON:  Chest radiograph 09/21/2016. FINDINGS: Tracheostomy tube terminates in the mid trachea. Patient is rotated to the left. Persistent consolidation of left hemithorax with leftward mediastinal shift. Unchanged heterogeneous opacities scattered throughout the right lung. Left upper extremity PICC line tip projects over the superior vena cava. Monitoring leads overlie the patient. IMPRESSION: Similar appearance of the chest with persistent opacity of the left hemithorax and leftward mediastinal shift. Unchanged scattered heterogeneous opacities right lung. Electronically Signed   By: Annia Beltrew  Davis M.D.   On: 09/22/2016 14:41     STUDIES:  5/29 CT abdomen > feeding tube in duodenum, nephrolithiasis  CULTURES: 5/29 blood > ngtd > 5/30 resp > gram neg rods and coccobacilli > reincubated 5/31 BCx 2 > 5/31 Sputum Cx > 6/1 UC >   ANTIBIOTICS: 5/29 cefepime>  5/29 zyvox> 5/31 5/31 vanc (rxn redmans) >  SIGNIFICANT EVENTS: 5/29  Admit from [name]  5/31 Transfer to SDU 6/1 Back to ICU for hypotension, worsening resp distress on MV  LINES/TUBES: Chronic tracheostomy Chronic G-J tube  DISCUSSION: 26 y/o male with TBI here with acute on chronic respiratory failure due to a bronchiectasis flare.  ASSESSMENT / PLAN:  PULMONARY A: Acute on chronic respiratory failure with hypoxemia Bronchiectasis flare Trach/ VDRF P:   Cont full vent support rx pneumonia   CARDIOVASCULAR A:  Septic Shock-->now resolved. Remains tachycardic P:  Cont MIVFs Cont tele  See ID section   RENAL A:   Hypophosphatemia Hypernatremia P:   Adjust free water F/u am chemistry   GASTROINTESTINAL A:   Chronic G tube> no leakage 6/1 P:   Cont tube feeds Cont PPI  HEMATOLOGIC A:   Chronic Anemia without bleeding -  Hgb stable after 1 unit PRBC 5/31 P:  Trend cbc Transfuse per protocol Dougherty heparin   INFECTIOUS A:   HCAP Bronchiectasis flare High risk for candidiasis  Leukocytosis-  Infection +/- response to steroids R/o osteomyelitis from sacral wound Broadened abx  P:   Cont meropenem, vanc and antifungal  F/u cultures.  Narrow as able  ENDOCRINE A:   At risk for hypoglycemia  R/o adrenal fatigue Cortisol 11.5 (5/31) P:   Cont solu-cortef   Trend glycemic control  NEUROLOGIC A:   Anoxic brain injury P:   Cont support   FAMILY  - Updates: No family at bedside.   Discussion 26 year old male w/ anoxic brain injury w/ acute on chronic infections. A little better than yesterday. Cultures all pending.  My cc time 34 min  Simonne Martinet ACNP-BC [facility] Pulmonary/Critical Care Pager # [phone] OR # [phone] if no answer

## 2016-09-24 DIAGNOSIS — L98424 Non-pressure chronic ulcer of back with necrosis of bone: Secondary | ICD-10-CM | POA: Diagnosis not present

## 2016-09-24 DIAGNOSIS — J9601 Acute respiratory failure with hypoxia: Secondary | ICD-10-CM | POA: Diagnosis not present

## 2016-09-24 DIAGNOSIS — J189 Pneumonia, unspecified organism: Secondary | ICD-10-CM | POA: Diagnosis not present

## 2016-09-24 LAB — GLUCOSE, CAPILLARY
GLUCOSE-CAPILLARY: 153 mg/dL — AB (ref 65–99)
GLUCOSE-CAPILLARY: 149 mg/dL — AB (ref 65–99)
GLUCOSE-CAPILLARY: 113 mg/dL — AB (ref 65–99)
GLUCOSE-CAPILLARY: 141 mg/dL — AB (ref 65–99)
Glucose-Capillary: 144 mg/dL — ABNORMAL HIGH (ref 65–99)
Glucose-Capillary: 160 mg/dL — ABNORMAL HIGH (ref 65–99)

## 2016-09-24 LAB — CBC
HCT: 25 % — ABNORMAL LOW (ref 39.0–52.0)
Hemoglobin: 6.8 g/dL — CL (ref 13.0–17.0)
MCH: 26.3 pg (ref 26.0–34.0)
MCHC: 27.2 g/dL — AB (ref 30.0–36.0)
MCV: 96.5 fL (ref 78.0–100.0)
PLATELETS: 180 10*3/uL (ref 150–400)
RBC: 2.59 MIL/uL — ABNORMAL LOW (ref 4.22–5.81)
RDW: 17.1 % — AB (ref 11.5–15.5)
WBC: 10.5 10*3/uL (ref 4.0–10.5)

## 2016-09-24 LAB — CULTURE, BAL-QUANTITATIVE W GRAM STAIN
Culture: >=100000 — AB
Special Requests: NORMAL

## 2016-09-24 LAB — HEMOGLOBIN AND HEMATOCRIT, BLOOD
HCT: 27.8 % — ABNORMAL LOW (ref 39.0–52.0)
Hemoglobin: 7.7 g/dL — ABNORMAL LOW (ref 13.0–17.0)

## 2016-09-24 LAB — COMPREHENSIVE METABOLIC PANEL
ALT: 18 U/L (ref 17–63)
ANION GAP: 5 (ref 5–15)
AST: 10 U/L — ABNORMAL LOW (ref 15–41)
Albumin: 1.6 g/dL — ABNORMAL LOW (ref 3.5–5.0)
Alkaline Phosphatase: 362 U/L — ABNORMAL HIGH (ref 38–126)
BUN: 16 mg/dL (ref 6–20)
CHLORIDE: 106 mmol/L (ref 101–111)
CO2: 37 mmol/L — AB (ref 22–32)
Calcium: 7.7 mg/dL — ABNORMAL LOW (ref 8.9–10.3)
Glucose, Bld: 147 mg/dL — ABNORMAL HIGH (ref 65–99)
POTASSIUM: 3.5 mmol/L (ref 3.5–5.1)
SODIUM: 148 mmol/L — AB (ref 135–145)
Total Bilirubin: 0.4 mg/dL (ref 0.3–1.2)
Total Protein: 5.6 g/dL — ABNORMAL LOW (ref 6.5–8.1)

## 2016-09-24 LAB — RETICULOCYTES
RBC.: 2.69 MIL/uL — AB (ref 4.22–5.81)
RETIC COUNT ABSOLUTE: 16.1 10*3/uL — AB (ref 19.0–186.0)
RETIC CT PCT: 0.6 % (ref 0.4–3.1)

## 2016-09-24 LAB — CULTURE, BLOOD (ROUTINE X 2)
Culture: NO GROWTH
Culture: NO GROWTH
Special Requests: ADEQUATE
Special Requests: ADEQUATE

## 2016-09-24 LAB — PREPARE RBC (CROSSMATCH)

## 2016-09-24 LAB — SEDIMENTATION RATE: SED RATE: 70 mm/h — AB (ref 0–16)

## 2016-09-24 LAB — CULTURE, RESPIRATORY W GRAM STAIN: Culture: NORMAL

## 2016-09-24 LAB — CULTURE, RESPIRATORY: SPECIAL REQUESTS: NORMAL

## 2016-09-24 MED ORDER — SODIUM CHLORIDE 0.9 % IV SOLN
Freq: Once | INTRAVENOUS | Status: AC
  Administered 2016-09-24: 08:00:00 via INTRAVENOUS

## 2016-09-24 MED ORDER — FENTANYL CITRATE (PF) 100 MCG/2ML IJ SOLN
100.0000 ug | INTRAMUSCULAR | Status: DC | PRN
  Administered 2016-09-26 – 2016-09-27 (×2): 100 ug via INTRAVENOUS
  Filled 2016-09-24 (×3): qty 2

## 2016-09-24 MED ORDER — FENTANYL CITRATE (PF) 100 MCG/2ML IJ SOLN
100.0000 ug | INTRAMUSCULAR | Status: DC | PRN
  Administered 2016-09-25 – 2016-09-26 (×2): 100 ug via INTRAVENOUS
  Filled 2016-09-24 (×2): qty 2

## 2016-09-24 NOTE — Progress Notes (Signed)
Pharmacy Antibiotic Note  Sean Palmer is a 26 y.o. male admitted on 09/19/2016 with sepsis.  Pharmacy has been consulted for vancomycin dosing. Today is day 3 of vancomycin, meropenem and anidulafungin. Patient's renal function remains stable.   Plan: - Continue vancomycin 750 mg IV q12h - Continue meropenem 1g IV q8h - Continue anidulafungin 100 mg q24h - Check VT tomorrow if planning to continue vancomcyin  Height: _0  (172.7 cm) Weight: 133 lb 2.5 oz (60.4 kg) IBW/kg (Calculated) : 68.4  Temp (24hrs), Avg:99.1 F (37.3 C), Min:98.6 F (37 C), Max:99.6 F (37.6 C)   Recent Labs Lab 09/19/16 0303  09/21/16 0420 09/21/16 1354 09/21/16 1858 09/21/16 2103 09/22/16 0321 09/22/16 0859 09/22/16 1820 09/23/16 0430 09/24/16 0500  WBC  --   < > 5.8  --  6.2  --  14.4*  --   --  18.4* 10.5  CREATININE  --   < > 0.40* 0.30*  --   --  <0.30*  --  0.41* 0.35* <0.30*  LATICACIDVEN 1.24  --   --   --   --  0.5  --  0.7  --   --   --   < > = values in this interval not displayed.  CrCl cannot be calculated (This lab value cannot be used to calculate CrCl because it is not a number: <0.30).    Allergies  Allergen Reactions  . Vancomycin Other (See Comments)    redmans syndrome  . Piperacillin Other (See Comments)    Resistant per [facility] paperwork    Antimicrobials this admission: Cefepime 5/29>>6/1 Zyvox 5/29>>5/31 Vanc 6/1>> Merrem 6/1>> Eraxis 6/1>>  Microbiology results: 5/29 Blood - NGTD 5/29 Trach Asp- pending 5/29 MRSA PCR-  neg 5/30 Trach asp: multiple org 5/31 BCx:  ngtd 5/31 BAL: no org seen 6/1 UCx: multi species  Thank you for allowing pharmacy to be a part of this patient's care.  Dimitri Ped, PharmD, BCPS PGY-2 Infectious Diseases Pharmacy Resident Pager: [phone] 09/24/2016 10:15 AM

## 2016-09-24 NOTE — Progress Notes (Signed)
PULMONARY / CRITICAL CARE MEDICINE   Name: PARKER WHERLEY MRN: [id number] DOB: [birth year]    ADMISSION DATE:  09/19/2016 CONSULTATION DATE:  09/19/16  REFERRING MD:  Horton  CHIEF COMPLAINT:  Dyspnea  HISTORY OF PRESENT ILLNESS:   26 y/o male with a history of TBI at age 72 who resides at [place], chronic tracheostomy and chronic ventilatory support admitted on 5/29 with acute respiratory failure with hypoxemia due to bronchiectasis.    SUBJECTIVE:  No acute events Remains on low dose levophed Oxygenation improved   VITAL SIGNS: BP (!) 109/52   Pulse (!) 109   Temp 98.9 F (37.2 C) (Axillary)   Resp (!) 26   Ht 5' 8"  (1.727 m)   Wt 60.4 kg (133 lb 2.5 oz)   SpO2 93%   BMI 20.25 kg/m   HEMODYNAMICS:  VENTILATOR SETTINGS: Vent Mode: PRVC FiO2 (%):  [40 %-50 %] 40 % Set Rate:  [26 bmp] 26 bmp Vt Set:  [480 mL] 480 mL PEEP:  [8 cmH20] 8 cmH20 Plateau Pressure:  [27 cmH20-30 cmH20] 28 cmH20  INTAKE / OUTPUT: I/O last 3 completed shifts: In: 4424.9 [I.V.:1444.9; NG/GT:2200; IV Piggyback:780] Out: 2675 [Urine:2675]  PHYSICAL EXAMINATION:  General:  In bed HENT: NCAT trach site clean, dry PULM: wheezing most significant on left, some rhonchi on right, good air movement CV: Tachy, regular, no mgr GI: BS+, soft, nontender, PEG in place MSK: diminished bulk and tone Derm: sacral ulcer to bone Neuro: eyes open, cough/facial twitch occasionally with stimuli    LABS:  BMET  Recent Labs Lab 09/22/16 1820 09/23/16 0430 09/24/16 0500  NA 141 147* 148*  K 3.4* 3.5 3.5  CL 108 109 106  CO2 30 32 37*  BUN 12 14 16   CREATININE 0.41* 0.35* <0.30*  GLUCOSE 438* 176* 147*    Electrolytes  Recent Labs Lab 09/19/16 1712 09/20/16 0435 09/20/16 1625  09/21/16 0930  09/22/16 1820 09/23/16 0430 09/24/16 0500  CALCIUM  --  8.3*  --   < >  --   < > 7.2* 7.7* 7.7*  MG 2.7* 2.5* 2.2  --   --   --   --   --   --   PHOS 1.6* 1.9* 2.6  --  2.9  --   --   --    --   < > = values in this interval not displayed.  CBC  Recent Labs Lab 09/22/16 0321 09/23/16 0430 09/24/16 0500 09/24/16 1112  WBC 14.4* 18.4* 10.5  --   HGB 8.2* 7.7* 6.8* 7.7*  HCT 29.1* 28.1* 25.0* 27.8*  PLT 253 229 180  --     Coag's  Recent Labs Lab 09/21/16 1858  APTT 61*  INR 1.47    Sepsis Markers  Recent Labs Lab 09/19/16 0303 09/21/16 2103 09/22/16 0004 09/22/16 0850 09/22/16 0859 09/23/16 0430  LATICACIDVEN 1.24 0.5  --   --  0.7  --   PROCALCITON  --   --  1.10 0.78  --  1.07    ABG  Recent Labs Lab 09/21/16 2110 09/22/16 0625 09/22/16 0638  PHART 7.410 7.496* 7.314*  PCO2ART 52.7* 41.8 67.0*  PO2ART 134* 47.0* 60.0*    Liver Enzymes  Recent Labs Lab 09/19/16 0250 09/24/16 0500  AST 18 10*  ALT 18 18  ALKPHOS 124 362*  BILITOT 1.0 0.4  ALBUMIN 2.5* 1.6*    Cardiac Enzymes  Recent Labs Lab 09/21/16 2103 09/22/16 0321 09/22/16 0850  TROPONINI <0.03 0.03* <  0.03    Glucose  Recent Labs Lab 09/23/16 1646 09/23/16 2110 09/23/16 2335 09/24/16 0448 09/24/16 0907 09/24/16 1232  GLUCAP 140* 149* 165* 144* 153* 149*    Imaging No results found.   STUDIES:  5/29 CT abdomen > feeding tube in duodenum, nephrolithiasis  CULTURES: 5/29 blood > ngtd > 5/30 resp > gram neg rods and coccobacilli > reincubated 5/31 BCx 2 > 5/31 Sputum Cx > 6/1 UC >   ANTIBIOTICS: 5/29 cefepime>  5/29 zyvox> 5/31 5/31 vanc (rxn redmans) >  SIGNIFICANT EVENTS: 5/29  Admit from [facility]  5/31 Transfer to SDU 6/1 Back to ICU for hypotension, worsening resp distress on MV  LINES/TUBES: Chronic tracheostomy Chronic G-J tube  DISCUSSION: 26 y/o male with TBI here with acute on chronic respiratory failure due to a bronchiectasis flare/HCAP.  ASSESSMENT / PLAN:  PULMONARY A: Acute on chronic respiratory failure with hypoxemia Bronchiectasis flare Trach status HCAP P:   Cont full vent support Pulmonary toilette VAP  prevention Trach care  CARDIOVASCULAR A:  Septic Shock-->pressor requirement minimal, due to vasopressors P:  Cont Tele See ID section   RENAL A:   Mild hypernatremia P:   Continue free water Repeat phos/mg labs Monitor BMET and UOP Replace electrolytes as needed  GASTROINTESTINAL A:   Chronic G tube> no leakage 6/1 Elevated alk phos, normal bili, could be due to bony involvement from sacral infection P:   Cont tube feeds Cont PPI Check RUQ ultrasound  HEMATOLOGIC A:   Chronic normocytic anemia, no bleeding, Hgb 6.8 again on 6/3 requiring transfusion P:  Check reticulocyte count Monitor for bleeding Transfuse if Hgb < 7gm/dL  INFECTIOUS A:   HCAP Bronchiectasis flare High risk for candidiasis  Leukocytosis-  Infection +/- response to steroids R/o osteomyelitis from sacral wound Covering empirically for candidemia P:   Cont meropenem, vanc and antifungal  F/u cultures.  Narrow as able Consider CT sacrum on 6/3 Check ESR  ENDOCRINE A:   At risk for hypoglycemia  R/o adrenal fatigue Cortisol 11.5 (5/31) P:   Cont solu-cortef   Trend glycemic control  NEUROLOGIC A:   Anoxic brain injury P:   Cont support   FAMILY  - Updates: Called father on both numbers listed 6/3, had to leave message as no answer at either.  My cc time 33 minutes  Roselie Awkward, MD [place] [facility] Pager: [phone] Cell: [phone] After 3pm or if no response, call [phone]

## 2016-09-24 NOTE — Progress Notes (Signed)
eLink Physician-Brief Progress Note Patient Name: Sean Palmer DOB: November 09, 1990 MRN: [id number]   Date of Service  09/24/2016  HPI/Events of Note  Hgb drop from 7.7 to 6.8  eICU Interventions  Transfuse 1 unit pRBC Post-transfusion CBC     Intervention Category Major Interventions: Other:  DETERDING,ELIZABETH 09/24/2016, 6:36 AM

## 2016-09-25 ENCOUNTER — Inpatient Hospital Stay (HOSPITAL_COMMUNITY): Payer: Medicare Other

## 2016-09-25 DIAGNOSIS — J9611 Chronic respiratory failure with hypoxia: Secondary | ICD-10-CM | POA: Diagnosis not present

## 2016-09-25 DIAGNOSIS — J9601 Acute respiratory failure with hypoxia: Secondary | ICD-10-CM | POA: Diagnosis not present

## 2016-09-25 DIAGNOSIS — Z515 Encounter for palliative care: Secondary | ICD-10-CM | POA: Diagnosis not present

## 2016-09-25 DIAGNOSIS — J189 Pneumonia, unspecified organism: Secondary | ICD-10-CM | POA: Diagnosis not present

## 2016-09-25 LAB — BPAM RBC
Blood Product Expiration Date: 201806252359
Blood Product Expiration Date: 201806252359
ISSUE DATE / TIME: 201805311033
ISSUE DATE / TIME: 201806030710
Unit Type and Rh: 5100
Unit Type and Rh: 5100

## 2016-09-25 LAB — TYPE AND SCREEN
ABO/RH(D): O POS
ANTIBODY SCREEN: NEGATIVE
UNIT DIVISION: 0
Unit division: 0

## 2016-09-25 LAB — BASIC METABOLIC PANEL
ANION GAP: 4 — AB (ref 5–15)
BUN: 20 mg/dL (ref 6–20)
CALCIUM: 7.9 mg/dL — AB (ref 8.9–10.3)
CHLORIDE: 107 mmol/L (ref 101–111)
CO2: 38 mmol/L — AB (ref 22–32)
Creatinine, Ser: <0.3 mg/dL — ABNORMAL LOW (ref 0.61–1.24)
Glucose, Bld: 102 mg/dL — ABNORMAL HIGH (ref 65–99)
POTASSIUM: 3.2 mmol/L — AB (ref 3.5–5.1)
Sodium: 149 mmol/L — ABNORMAL HIGH (ref 135–145)

## 2016-09-25 LAB — GLUCOSE, CAPILLARY
GLUCOSE-CAPILLARY: 99 mg/dL (ref 65–99)
GLUCOSE-CAPILLARY: 137 mg/dL — AB (ref 65–99)
GLUCOSE-CAPILLARY: 134 mg/dL — AB (ref 65–99)
Glucose-Capillary: 112 mg/dL — ABNORMAL HIGH (ref 65–99)
Glucose-Capillary: 104 mg/dL — ABNORMAL HIGH (ref 65–99)

## 2016-09-25 LAB — CBC WITH DIFFERENTIAL/PLATELET
BASOS PCT: 0 %
Basophils Absolute: 0 10*3/uL (ref 0.0–0.1)
Eosinophils Absolute: 0 10*3/uL (ref 0.0–0.7)
Eosinophils Relative: 0 %
HEMATOCRIT: 28.6 % — AB (ref 39.0–52.0)
Hemoglobin: 8.2 g/dL — ABNORMAL LOW (ref 13.0–17.0)
LYMPHS ABS: 1.4 10*3/uL (ref 0.7–4.0)
Lymphocytes Relative: 17 %
MCH: 27.4 pg (ref 26.0–34.0)
MCHC: 28.7 g/dL — AB (ref 30.0–36.0)
MCV: 95.7 fL (ref 78.0–100.0)
MONOS PCT: 4 %
Monocytes Absolute: 0.3 10*3/uL (ref 0.1–1.0)
NEUTROS ABS: 6.5 10*3/uL (ref 1.7–7.7)
NEUTROS PCT: 79 %
Platelets: 163 10*3/uL (ref 150–400)
RBC: 2.99 MIL/uL — ABNORMAL LOW (ref 4.22–5.81)
RDW: 17.1 % — ABNORMAL HIGH (ref 11.5–15.5)
WBC: 8.2 10*3/uL (ref 4.0–10.5)

## 2016-09-25 LAB — MAGNESIUM: MAGNESIUM: 1.7 mg/dL (ref 1.7–2.4)

## 2016-09-25 LAB — PHOSPHORUS: Phosphorus: 1 mg/dL — CL (ref 2.5–4.6)

## 2016-09-25 LAB — VANCOMYCIN, TROUGH: Vancomycin Tr: 15 ug/mL (ref 15–20)

## 2016-09-25 MED ORDER — POTASSIUM PHOSPHATES 15 MMOLE/5ML IV SOLN
20.0000 meq | Freq: Once | INTRAVENOUS | Status: AC
  Administered 2016-09-25: 20 meq via INTRAVENOUS
  Filled 2016-09-25: qty 4.55

## 2016-09-25 MED ORDER — GADOBENATE DIMEGLUMINE 529 MG/ML IV SOLN
15.0000 mL | Freq: Once | INTRAVENOUS | Status: AC | PRN
  Administered 2016-09-25: 13 mL via INTRAVENOUS

## 2016-09-25 MED ORDER — FREE WATER
250.0000 mL | Status: DC
  Administered 2016-09-25 – 2016-09-28 (×18): 250 mL

## 2016-09-25 MED ORDER — MAGNESIUM SULFATE 2 GM/50ML IV SOLN
2.0000 g | Freq: Once | INTRAVENOUS | Status: AC
Start: 2016-09-25 — End: 2016-09-25
  Administered 2016-09-25: 2 g via INTRAVENOUS
  Filled 2016-09-25: qty 50

## 2016-09-25 NOTE — Progress Notes (Signed)
Patient's father will be here at [facility]5PM 6/4 today to discuss goals of care. Will discuss his current condition and trajectory - chronically critically ill. Father continues to want aggressive interventions and highest possible level of treatment and care.  Sean MaltaElizabeth Golding, DO Palliative Medicine [phone]

## 2016-09-25 NOTE — Progress Notes (Signed)
Pharmacy Antibiotic Note  Sean Palmer is a 26 y.o. male admitted on 09/19/2016 with sepsis.  Pharmacy has been consulted for vancomycin dosing. Today is day 3 of vancomycin, meropenem and anidulafungin. Patient's renal function remains stable.   Vancomycin trough tonight is at goal - 15. No changes needed at this time.  Plan: - Continue vancomycin 750 mg IV q12h - Continue meropenem 1g IV q8h - Continue anidulafungin 100 mg q24h  Height: _0  (172.7 cm) Weight: 136 lb 0.4 oz (61.7 kg) IBW/kg (Calculated) : 68.4  Temp (24hrs), Avg:97.5 F (36.4 C), Min:95.6 F (35.3 C), Max:98.8 F (37.1 C)   Recent Labs Lab 09/19/16 0303  09/21/16 1858 09/21/16 2103 09/22/16 0321 09/22/16 0859 09/22/16 1820 09/23/16 0430 09/24/16 0500 09/25/16 0430 09/25/16 2107  WBC  --   < > 6.2  --  14.4*  --   --  18.4* 10.5 8.2  --   CREATININE  --   < >  --   --  <0.30*  --  0.41* 0.35* <0.30* <0.30*  --   LATICACIDVEN 1.24  --   --  0.5  --  0.7  --   --   --   --   --   VANCOTROUGH  --   --   --   --   --   --   --   --   --   --  15  < > = values in this interval not displayed.  CrCl cannot be calculated (This lab value cannot be used to calculate CrCl because it is not a number: <0.30).    Allergies  Allergen Reactions  . Vancomycin Other (See Comments)    redmans syndrome  . Piperacillin Other (See Comments)    Resistant per [facility] paperwork    Antimicrobials this admission: Cefepime 5/29>>6/1 Zyvox 5/29>>5/31 Vanc 6/1>> Merrem 6/1>> Eraxis 6/1>>  Microbiology results: 5/29 Blood - NGTD 5/29 Trach Asp- pending 5/29 MRSA PCR-  neg 5/30 Trach asp: multiple org 5/31 BCx:  ngtd 5/31 BAL: no org seen 6/1 UCx: multi species  Thank you for allowing pharmacy to be a part of this patient's care.  Erin Hearing PharmD., BCPS Clinical Pharmacist Pager [phone] 09/25/2016 10:04 PM

## 2016-09-25 NOTE — Progress Notes (Signed)
PULMONARY / CRITICAL CARE MEDICINE   Name: Sean Palmer MRN: [id number] DOB: 10/20/1990    ADMISSION DATE:  09/19/2016 CONSULTATION DATE:  09/19/16  REFERRING MD:  Horton  CHIEF COMPLAINT:  Dyspnea  BRIEF SUMMARY:  26 y/o male with a history of TBI at age 37 who resides at [place], chronic tracheostomy and chronic ventilatory support admitted on 5/29 with acute respiratory failure with hypoxemia due to bronchiectasis.    SUBJECTIVE:  Off levophed gtt since 0400, O2 at 40%  VITAL SIGNS: BP 110/63   Pulse 90   Temp 98.5 F (36.9 C) (Axillary)   Resp (!) 29   Ht _0  (1.727 m)   Wt 136 lb 0.4 oz (61.7 kg)   SpO2 94%   BMI 20.68 kg/m   HEMODYNAMICS:  VENTILATOR SETTINGS: Vent Mode: PRVC FiO2 (%):  [40 %] 40 % Set Rate:  [24 bmp-26 bmp] 26 bmp Vt Set:  [480 mL] 480 mL PEEP:  [8 cmH20] 8 cmH20 Plateau Pressure:  [28 cmH20-33 cmH20] 33 cmH20  INTAKE / OUTPUT: I/O last 3 completed shifts: In: 3887.7 [I.V.:847.7; Blood:310; NG/GT:2000; IV Piggyback:730] Out: 1625 [Urine:1625]  PHYSICAL EXAMINATION: General: chronically ill appearing young adult male on vent  HEENT: MM pink/moist, [place] trach midline c/d/i  Neuro: occasionally opens eyes, no follow commands, no response to verbal stimuli, occasional facial twitching CV: s1s2 rrr, no m/r/g, SR on monitor  PULM: even/non-labored, lungs bilaterally with coarse rhonchi L>R BH:ALPF, non-tender, bsx4 active  Extremities: warm/dry, trace generalized edema  Skin: sacral ulcer to bone, multiple scattered lesions   LABS:  BMET  Recent Labs Lab 09/23/16 0430 09/24/16 0500 09/25/16 0430  NA 147* 148* 149*  K 3.5 3.5 3.2*  CL 109 106 107  CO2 32 37* 38*  BUN _1 CREATININE 0.35* <0.30* <0.30*  GLUCOSE 176* 147* 102*    Electrolytes  Recent Labs Lab 09/20/16 0435 09/20/16 1625  09/21/16 0930  09/23/16 0430 09/24/16 0500 09/25/16 0430  CALCIUM 8.3*  --   < >  --   < > 7.7* 7.7* 7.9*  MG 2.5* 2.2   --   --   --   --   --  1.7  PHOS 1.9* 2.6  --  2.9  --   --   --  1.0*  < > = values in this interval not displayed.  CBC  Recent Labs Lab 09/23/16 0430 09/24/16 0500 09/24/16 1112 09/25/16 0430  WBC 18.4* 10.5  --  8.2  HGB 7.7* 6.8* 7.7* 8.2*  HCT 28.1* 25.0* 27.8* 28.6*  PLT 229 180  --  163    Coag's  Recent Labs Lab 09/21/16 1858  APTT 61*  INR 1.47    Sepsis Markers  Recent Labs Lab 09/19/16 0303 09/21/16 2103 09/22/16 0004 09/22/16 0850 09/22/16 0859 09/23/16 0430  LATICACIDVEN 1.24 0.5  --   --  0.7  --   PROCALCITON  --   --  1.10 0.78  --  1.07    ABG  Recent Labs Lab 09/21/16 2110 09/22/16 0625 09/22/16 0638  PHART 7.410 7.496* 7.314*  PCO2ART 52.7* 41.8 67.0*  PO2ART 134* 47.0* 60.0*    Liver Enzymes  Recent Labs Lab 09/19/16 0250 09/24/16 0500  AST 18 10*  ALT 18 18  ALKPHOS 124 362*  BILITOT 1.0 0.4  ALBUMIN 2.5* 1.6*    Cardiac Enzymes  Recent Labs Lab 09/21/16 2103 09/22/16 0321 09/22/16 0850  TROPONINI <0.03 0.03* <0.03  Glucose  Recent Labs Lab 09/24/16 1232 09/24/16 1603 09/24/16 1926 09/24/16 2329 09/25/16 0337 09/25/16 0722  GLUCAP 149* 113* 141* 160* 112* 99    Imaging No results found.   STUDIES:  5/29  CT abdomen >> feeding tube in duodenum, nephrolithiasis 6/04  RUQ [place] >>  CULTURES: 5/29 blood >> negative 5/30 resp > gram neg rods and coccobacilli > reincubated 5/31 BCx 2 >> 5/31 Sputum Cx >> normal flora 6/01 UC >> 6/01  BAL >> abundant WBC, >100k normal flora   ANTIBIOTICS: Cefepime 5/29 >> 6/1 Zyvox 5/29 >> 5/31 Vanc (rxn redmans) 5/31 >> Anidulafungin 6/1 >>  Meropenem 6/1 >>   SIGNIFICANT EVENTS: 5/29  Admit from [facility]  5/31  Transfer to SDU 6/01  Back to ICU for hypotension, worsening resp distress on MV 6/03  O2 40%  LINES/TUBES: Chronic tracheostomy Chronic G-J tube  DISCUSSION: 26 y/o male with TBI here with acute on chronic respiratory failure due to a  bronchiectasis flare/HCAP.  ASSESSMENT / PLAN:  PULMONARY A: Acute on chronic respiratory failure with hypoxemia Bronchiectasis flare Trach status HCAP P:   PRVC 8 cc/kg  Wean PEEP / FiO2 for sats > 92% Pulmonary hygiene - frequent turning, suction as needed  Reduce CPT to Q4 hours VAP prevention measures Trach care per protocol  Intermittent CXR   CARDIOVASCULAR A:  Septic Shock - pressors weaned off 6/4 early am  P:  ICU monitoring  See ID   RENAL A:   Hypernatremia Hypokalemia  P:   Increase free water to 250 ml Q4 Trend BMP / urinary output Replace electrolytes as indicated, Kphos, Mg replacement 6/4 Avoid nephrotoxic agents, ensure adequate renal perfusion  GASTROINTESTINAL A:   Chronic G tube> no leakage 6/1 Elevated alk phos, normal bili, could be due to bony involvement from sacral infection P:   PPI for SUP  TF per Nutrition  Await RUQ [place]   HEMATOLOGIC A:   Chronic normocytic anemia, no bleeding, Hgb 6.8 again on 6/3 requiring transfusion P:  Trend CBC  Monitor for bleeding  Transfuse per ICU guidelines  INFECTIOUS A:   HCAP Bronchiectasis flare High risk for candidiasis - covering empirically for candidemia Leukocytosis-  Infection +/- response to steroids R/o osteomyelitis from sacral wound P:   Continue Meropenem + Vanco  Empiric antifungal coverage Follow cultures as above  Sed rate 70 MRI of sacrum to evaluate for osteomyelitis   ENDOCRINE A:   At risk for hypoglycemia  R/o adrenal fatigue Cortisol 11.5 (5/31) P:   Continue stress steroids, consider d/c in am 6/5 if remains off pressors  Monitor glucose on BMP with steroid administration   NEUROLOGIC A:   Anoxic brain injury P:   Supportive care PT efforts as able  FAMILY  - Updates:  Pending family meeting on 6/4 with father / palliative care.    CC Time: 30 minutes  Noe Gens, NP-C [place] [facility] & Critical [facility] Pgr: [phone] or if no answer  [phone] 09/25/2016, 10:49 AM
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
## 2016-09-26 ENCOUNTER — Inpatient Hospital Stay (HOSPITAL_COMMUNITY): Payer: Medicare Other

## 2016-09-26 DIAGNOSIS — J9621 Acute and chronic respiratory failure with hypoxia: Secondary | ICD-10-CM | POA: Diagnosis not present

## 2016-09-26 DIAGNOSIS — Z931 Gastrostomy status: Secondary | ICD-10-CM

## 2016-09-26 DIAGNOSIS — A419 Sepsis, unspecified organism: Secondary | ICD-10-CM | POA: Diagnosis not present

## 2016-09-26 DIAGNOSIS — R0602 Shortness of breath: Secondary | ICD-10-CM | POA: Diagnosis not present

## 2016-09-26 DIAGNOSIS — M4628 Osteomyelitis of vertebra, sacral and sacrococcygeal region: Secondary | ICD-10-CM

## 2016-09-26 DIAGNOSIS — J9622 Acute and chronic respiratory failure with hypercapnia: Secondary | ICD-10-CM | POA: Diagnosis not present

## 2016-09-26 DIAGNOSIS — J9601 Acute respiratory failure with hypoxia: Secondary | ICD-10-CM | POA: Diagnosis not present

## 2016-09-26 LAB — GLUCOSE, CAPILLARY
GLUCOSE-CAPILLARY: 113 mg/dL — AB (ref 65–99)
GLUCOSE-CAPILLARY: 168 mg/dL — AB (ref 65–99)
Glucose-Capillary: 132 mg/dL — ABNORMAL HIGH (ref 65–99)
Glucose-Capillary: 134 mg/dL — ABNORMAL HIGH (ref 65–99)
Glucose-Capillary: 136 mg/dL — ABNORMAL HIGH (ref 65–99)
Glucose-Capillary: 141 mg/dL — ABNORMAL HIGH (ref 65–99)
Glucose-Capillary: 151 mg/dL — ABNORMAL HIGH (ref 65–99)

## 2016-09-26 LAB — PHOSPHORUS: PHOSPHORUS: 2.5 mg/dL (ref 2.5–4.6)

## 2016-09-26 LAB — BASIC METABOLIC PANEL
ANION GAP: 8 (ref 5–15)
BUN: 18 mg/dL (ref 6–20)
CALCIUM: 8 mg/dL — AB (ref 8.9–10.3)
CO2: 38 mmol/L — AB (ref 22–32)
Chloride: 104 mmol/L (ref 101–111)
Creatinine, Ser: <0.3 mg/dL — ABNORMAL LOW (ref 0.61–1.24)
Glucose, Bld: 142 mg/dL — ABNORMAL HIGH (ref 65–99)
Potassium: 2.9 mmol/L — ABNORMAL LOW (ref 3.5–5.1)
Sodium: 150 mmol/L — ABNORMAL HIGH (ref 135–145)

## 2016-09-26 LAB — CBC
HEMATOCRIT: 28.8 % — AB (ref 39.0–52.0)
Hemoglobin: 8.2 g/dL — ABNORMAL LOW (ref 13.0–17.0)
MCH: 26.9 pg (ref 26.0–34.0)
MCHC: 28.5 g/dL — ABNORMAL LOW (ref 30.0–36.0)
MCV: 94.4 fL (ref 78.0–100.0)
PLATELETS: 216 10*3/uL (ref 150–400)
RBC: 3.05 MIL/uL — ABNORMAL LOW (ref 4.22–5.81)
RDW: 17.1 % — ABNORMAL HIGH (ref 11.5–15.5)
WBC: 20.7 10*3/uL — AB (ref 4.0–10.5)

## 2016-09-26 LAB — MAGNESIUM: Magnesium: 2 mg/dL (ref 1.7–2.4)

## 2016-09-26 MED ORDER — POTASSIUM CHLORIDE 20 MEQ/15ML (10%) PO SOLN
40.0000 meq | ORAL | Status: AC
  Administered 2016-09-26 (×2): 40 meq
  Filled 2016-09-26 (×2): qty 30

## 2016-09-26 MED ORDER — PIVOT 1.5 CAL PO LIQD
240.0000 mL | Freq: Every day | ORAL | Status: DC
  Administered 2016-09-26 – 2016-09-28 (×9): 240 mL
  Filled 2016-09-26 (×2): qty 1000

## 2016-09-26 NOTE — Progress Notes (Signed)
eLink Physician-Brief Progress Note Patient Name: Sean Palmer DOB: Apr 23, 1991 MRN: [id number]   Date of Service  09/26/2016  HPI/Events of Note  Frequent loose stools. Patient has skin breakdown/lesions on buttocks. Request for Flexiseal.   eICU Interventions  Will order: 1. Place Flexiseal.      Intervention Category Major Interventions: Other:  Lenell AntuSommer,Oniya Mandarino Eugene 09/26/2016, 5:28 PM

## 2016-09-26 NOTE — Procedures (Signed)
PCCM Video Bronchoscopy Procedure Note  The patient was informed of the risks (including but not limited to bleeding, infection, respiratory failure, lung injury, tooth/oral injury) and benefits of the procedure and gave consent, see chart.  Indication: Acute respiratory failure with hypoxemia  Post Procedure Diagnosis: same  Location: [facility]  Condition pre procedure: Critically ill on vent  Medications for procedure: Fentanyl 100mcg IV  Procedure description: The bronchoscope was introduced through the endotracheal tube,and passed to the bilateral lungs to the level of the subsegmental bronchi throughout the tracheobronchial tree.  Airway exam revealed thick mucus in the right lower lobe plugging the airway.  This was suctioned with some difficulty but at the completion of the procedure the airway was patent.   Procedures performed: therapeutic aspiration of the airways  Specimens sent: none  Condition post procedure: critically ill, on vent  EBL: None immediate  Complications: none immediate  Heber CarolinaBrent McQuaid, MD [facility] Pager: [phone] Cell: [phone] After 3pm or if no response, call [phone]

## 2016-09-26 NOTE — Progress Notes (Signed)
RT noted that patient has had slightly increased peak pressures on ventilator.  While CPT was performing, took patient off of ventilator and with assistance of RN, bag lavaged patient.  RT obtained a copious amount of thick, white, frothy secretions.  Placed patient back on ventilator and noticed that peak pressures were better.  Will continue to monitor.

## 2016-09-26 NOTE — Progress Notes (Signed)
Patient transported on vent to MRI and back to 88M-15 without complication.
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
## 2016-09-26 NOTE — Clinical Social Work Note (Signed)
[facility]. Clinical Social Worker will sign off for now as social work intervention is no longer needed. Please consult us again if new need arises.   PattersonBridget Jeanann Balinski, Connecticut[place]CSWA [phone]

## 2016-09-26 NOTE — Progress Notes (Signed)
eLink Physician-Brief Progress Note Patient Name: Sean Palmer DOB: 14-Nov-1990 MRN: [id number]   Date of Service  09/26/2016  HPI/Events of Note  k supp  eICU Interventions       Intervention Category Minor Interventions: Electrolytes abnormality - evaluation and management  Sean Palmer,Sean J. 09/26/2016, 5:40 AM

## 2016-09-26 NOTE — Care Management Note (Signed)
Case Management Note  Patient Details  Name: Sean Palmer MRN: [id number] Date of Birth: 20-May-1990  Subjective/Objective:  CM following for progression and d/c planning.                   Action/Plan: 09/26/2016 [facility], [name] following and preparing for transfer to that facility for ongoing ICU level care.   Expected Discharge Date:                  Expected Discharge Plan:  Long Term Acute Care (LTAC)  In-House Referral:  Clinical Social Work  Discharge planning Services  CM Consult  Post Acute Care Choice:    Choice offered to:     DME Arranged:    DME Agency:     HH Arranged:    HH Agency:     Status of Service:  In process, will continue to follow  If discussed at Long Length of Stay Meetings, dates discussed:    Additional Comments:  Starlyn SkeansRoyal, Corda Shutt U, RN 09/26/2016, 11:20 AM
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
## 2016-09-27 ENCOUNTER — Inpatient Hospital Stay (HOSPITAL_COMMUNITY): Payer: Medicare Other

## 2016-09-27 DIAGNOSIS — J9621 Acute and chronic respiratory failure with hypoxia: Secondary | ICD-10-CM | POA: Diagnosis not present

## 2016-09-27 DIAGNOSIS — J9622 Acute and chronic respiratory failure with hypercapnia: Secondary | ICD-10-CM | POA: Diagnosis not present

## 2016-09-27 DIAGNOSIS — J9611 Chronic respiratory failure with hypoxia: Secondary | ICD-10-CM | POA: Diagnosis not present

## 2016-09-27 LAB — BASIC METABOLIC PANEL
Anion gap: 6 (ref 5–15)
BUN: 17 mg/dL (ref 6–20)
CALCIUM: 8 mg/dL — AB (ref 8.9–10.3)
CHLORIDE: 105 mmol/L (ref 101–111)
CO2: 38 mmol/L — AB (ref 22–32)
Creatinine, Ser: <0.3 mg/dL — ABNORMAL LOW (ref 0.61–1.24)
Glucose, Bld: 107 mg/dL — ABNORMAL HIGH (ref 65–99)
Potassium: 3.1 mmol/L — ABNORMAL LOW (ref 3.5–5.1)
Sodium: 149 mmol/L — ABNORMAL HIGH (ref 135–145)

## 2016-09-27 LAB — CULTURE, BLOOD (ROUTINE X 2)
CULTURE: NO GROWTH
Culture: NO GROWTH
Special Requests: ADEQUATE
Special Requests: ADEQUATE

## 2016-09-27 LAB — GLUCOSE, CAPILLARY
GLUCOSE-CAPILLARY: 157 mg/dL — AB (ref 65–99)
GLUCOSE-CAPILLARY: 173 mg/dL — AB (ref 65–99)
Glucose-Capillary: 94 mg/dL (ref 65–99)
Glucose-Capillary: 141 mg/dL — ABNORMAL HIGH (ref 65–99)
Glucose-Capillary: 160 mg/dL — ABNORMAL HIGH (ref 65–99)
Glucose-Capillary: 125 mg/dL — ABNORMAL HIGH (ref 65–99)

## 2016-09-27 LAB — CBC
HEMATOCRIT: 27.2 % — AB (ref 39.0–52.0)
Hemoglobin: 7.7 g/dL — ABNORMAL LOW (ref 13.0–17.0)
MCH: 26.9 pg (ref 26.0–34.0)
MCHC: 28.3 g/dL — ABNORMAL LOW (ref 30.0–36.0)
MCV: 95.1 fL (ref 78.0–100.0)
PLATELETS: 153 10*3/uL (ref 150–400)
RBC: 2.86 MIL/uL — ABNORMAL LOW (ref 4.22–5.81)
RDW: 17.5 % — AB (ref 11.5–15.5)
WBC: 7.4 10*3/uL (ref 4.0–10.5)

## 2016-09-27 LAB — MAGNESIUM: Magnesium: 1.9 mg/dL (ref 1.7–2.4)

## 2016-09-27 MED ORDER — POTASSIUM CHLORIDE 20 MEQ/15ML (10%) PO SOLN
20.0000 meq | Freq: Once | ORAL | Status: AC
  Administered 2016-09-27: 20 meq via ORAL
  Filled 2016-09-27: qty 15
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
## 2016-09-27 NOTE — Care Management Note (Addendum)
Case Management Note  Patient Details  Name: Sean Palmer MRN: [id number] Date of Birth: 12-Aug-1990  Subjective/Objective:  CM following for progression and d/c planning.                   Action/Plan: 09/26/2016 [facility], [name] following and preparing for transfer to that facility for ongoing ICU level care.   Expected Discharge Date:                  Expected Discharge Plan:  Long Term Acute Care (LTAC)  In-House Referral:  Clinical Social Work  Discharge planning Services  CM Consult  Post Acute Care Choice:    Choice offered to:     DME Arranged:    DME Agency:     HH Arranged:    HH Agency:     Status of Service:  In process, will continue to follow  If discussed at Long Length of Stay Meetings, dates discussed:    Additional Comments: 09/28/16 - Office managerTransport package given to bedside nurse.  Nurse will contact [facility]  Nurse will also inform dad at time of transport  CM confirmed with attending pt is ready to discharge to [name] today,  Confirmed that [name] has a bed today, informed father that pt is medically stable to discharge to [name] today - father in agreement  [facility]/2016 [facility].  CM spoke with attending 09/26/16 - at this time ethic consult will not be pursued - discharge plan is [name].  [name] will accept pt when medically stable - per todays attending tenatively later this week due to current needs.  CM spoke with father today and father is in agreement [facility] Cherylann ParrClaxton, Arantza Darrington S, RN 09/27/2016, 8:55 AM
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
## 2016-09-28 ENCOUNTER — Inpatient Hospital Stay (HOSPITAL_COMMUNITY): Payer: Medicare Other

## 2016-09-28 DIAGNOSIS — R6521 Severe sepsis with septic shock: Secondary | ICD-10-CM | POA: Diagnosis not present

## 2016-09-28 DIAGNOSIS — M4628 Osteomyelitis of vertebra, sacral and sacrococcygeal region: Secondary | ICD-10-CM

## 2016-09-28 DIAGNOSIS — J9622 Acute and chronic respiratory failure with hypercapnia: Secondary | ICD-10-CM | POA: Diagnosis not present

## 2016-09-28 DIAGNOSIS — A419 Sepsis, unspecified organism: Secondary | ICD-10-CM | POA: Diagnosis not present

## 2016-09-28 DIAGNOSIS — Z515 Encounter for palliative care: Secondary | ICD-10-CM | POA: Diagnosis not present

## 2016-09-28 DIAGNOSIS — J189 Pneumonia, unspecified organism: Secondary | ICD-10-CM | POA: Diagnosis not present

## 2016-09-28 DIAGNOSIS — J9611 Chronic respiratory failure with hypoxia: Secondary | ICD-10-CM | POA: Diagnosis not present

## 2016-09-28 DIAGNOSIS — J9621 Acute and chronic respiratory failure with hypoxia: Secondary | ICD-10-CM | POA: Diagnosis not present

## 2016-09-28 LAB — BASIC METABOLIC PANEL
Anion gap: 7 (ref 5–15)
BUN: 24 mg/dL — AB (ref 6–20)
CHLORIDE: 103 mmol/L (ref 101–111)
CO2: 39 mmol/L — AB (ref 22–32)
Calcium: 8.1 mg/dL — ABNORMAL LOW (ref 8.9–10.3)
Creatinine, Ser: <0.3 mg/dL — ABNORMAL LOW (ref 0.61–1.24)
GLUCOSE: 113 mg/dL — AB (ref 65–99)
Potassium: 3.3 mmol/L — ABNORMAL LOW (ref 3.5–5.1)
Sodium: 149 mmol/L — ABNORMAL HIGH (ref 135–145)

## 2016-09-28 LAB — GLUCOSE, CAPILLARY
GLUCOSE-CAPILLARY: 134 mg/dL — AB (ref 65–99)
Glucose-Capillary: 116 mg/dL — ABNORMAL HIGH (ref 65–99)
Glucose-Capillary: 117 mg/dL — ABNORMAL HIGH (ref 65–99)

## 2016-09-28 LAB — CBC
HCT: 27.7 % — ABNORMAL LOW (ref 39.0–52.0)
Hemoglobin: 7.9 g/dL — ABNORMAL LOW (ref 13.0–17.0)
MCH: 26.8 pg (ref 26.0–34.0)
MCHC: 28.5 g/dL — AB (ref 30.0–36.0)
MCV: 93.9 fL (ref 78.0–100.0)
PLATELETS: 193 10*3/uL (ref 150–400)
RBC: 2.95 MIL/uL — ABNORMAL LOW (ref 4.22–5.81)
RDW: 18.2 % — AB (ref 11.5–15.5)
WBC: 8.4 10*3/uL (ref 4.0–10.5)

## 2016-09-28 MED ORDER — FENTANYL CITRATE (PF) 100 MCG/2ML IJ SOLN: 100.0000 ug | INTRAMUSCULAR | 0 refills | Status: AC | PRN

## 2016-09-28 MED ORDER — PANTOPRAZOLE SODIUM 40 MG PO PACK: 40.0000 mg | PACK | ORAL | Status: AC

## 2016-09-28 MED ORDER — POTASSIUM CHLORIDE 20 MEQ/15ML (10%) PO SOLN
40.0000 meq | Freq: Once | ORAL | Status: AC
  Administered 2016-09-28: 40 meq
  Filled 2016-09-28: qty 30

## 2016-09-28 MED ORDER — MIDAZOLAM HCL 2 MG/2ML IJ SOLN: 2.0000 mg | INTRAMUSCULAR | 0 refills | Status: AC | PRN

## 2016-09-28 MED ORDER — METHYLPREDNISOLONE SODIUM SUCC 125 MG IJ SOLR: 60.0000 mg | Freq: Two times a day (BID) | INTRAMUSCULAR | 0 refills | Status: AC

## 2016-09-28 MED ORDER — HEPARIN SODIUM (PORCINE) 5000 UNIT/ML IJ SOLN: 5000.0000 [IU] | Freq: Three times a day (TID) | INTRAMUSCULAR | Status: AC

## 2016-09-28 MED ORDER — VITAMINS A & D EX OINT: 1.0000 "application " | TOPICAL_OINTMENT | Freq: Two times a day (BID) | CUTANEOUS | 0 refills | Status: AC

## 2016-09-28 MED ORDER — ZINC OXIDE 40 % EX OINT: TOPICAL_OINTMENT | Freq: Two times a day (BID) | CUTANEOUS | 0 refills | Status: AC | PRN

## 2016-09-28 MED ORDER — ORAL CARE MOUTH RINSE: 15.0000 mL | Freq: Four times a day (QID) | OROMUCOSAL | 0 refills | Status: AC

## 2016-09-28 MED ORDER — ALBUTEROL SULFATE (2.5 MG/3ML) 0.083% IN NEBU: 2.5000 mg | INHALATION_SOLUTION | RESPIRATORY_TRACT | 12 refills | Status: AC

## 2016-09-28 MED ORDER — PIVOT 1.5 CAL PO LIQD: 240.0000 mL | Freq: Every day | ORAL | 0 refills | Status: AC

## 2016-09-28 MED ORDER — SODIUM CHLORIDE 0.9% FLUSH: 10.0000 mL | INTRAVENOUS | Status: AC | PRN

## 2016-09-28 MED ORDER — VANCOMYCIN HCL IN DEXTROSE 750-5 MG/150ML-% IV SOLN: 750.0000 mg | Freq: Two times a day (BID) | INTRAVENOUS | Status: AC

## 2016-09-28 MED ORDER — SODIUM CHLORIDE 0.9 % IV BOLUS (SEPSIS): 1000.0000 mL | Freq: Once | INTRAVENOUS | 0 refills | Status: AC

## 2016-09-28 MED ORDER — METHYLPREDNISOLONE SODIUM SUCC 125 MG IJ SOLR
60.0000 mg | Freq: Two times a day (BID) | INTRAMUSCULAR | Status: DC
  Administered 2016-09-28: 60 mg via INTRAVENOUS
  Filled 2016-09-28: qty 2

## 2016-09-28 MED ORDER — COLLAGENASE 250 UNIT/GM EX OINT: TOPICAL_OINTMENT | Freq: Every day | CUTANEOUS | 0 refills | Status: AC

## 2016-09-28 MED ORDER — SODIUM CHLORIDE 0.9 % IV SOLN: 1.0000 g | Freq: Three times a day (TID) | INTRAVENOUS | Status: AC

## 2016-09-28 NOTE — Progress Notes (Signed)
Palliative Care Follow up Note Reason: Goals of Care  Call received from patient's family, Sean Palmer and father Sean StaffFred Gillaspie Sr.- they are concerned that Sean Palmer's discharge back to [name] is premature. They had questions about the MRI results and the medical details of his care. They are increasingly concerned about Sean Palmer being shuffled around and the number of covered days affecting his treatment level of care. They are not opposed to Sean Palmer retruning to [name] but want him in the best possible condition for that return. I shared with them MRI results, sacral and ischial osteomyelitis. They had questions about treatment-antibiotics, covering the wounds, and also about complications. Additional questions about his IVC filter and anemia. I deferred to CCM and paged provider to discuss family concerns. Awaiting return call. Family still be seeking full scope medical treatment for his condition. I also spoke with Sean Palmer on 6/6 about the ethical concerns raised over Sean Palmer's care and about medical futility. I would advise against pursuing ethical debates and unilateral decision making -this has been played out already over the course of the last 7 years- Sean Palmer has declined and continues to have serious life threatening challenges- his father knows that he is fragile and may be dying.  I spoke with Sean Palmer at [name] regarding this case and the complexity of handling his medical care and treatment.   Time: 35 min Greater than 50%  of this time was spent counseling and coordinating care related to the above assessment and plan.   Sean MaltaElizabeth Icey Tello, DO Palliative Medicine [phone]

## 2016-09-28 NOTE — Progress Notes (Signed)
   08/06/16 1737  OT G-codes **NOT FOR INPATIENT CLASS**  Functional Assessment Tool Used Clinical judgement  Functional Limitation Self care  Self Care Current Status [phone]([id number]) CN  Self Care Goal Status [phone]) CN  Self Care Discharge Status [phone]([id number]) CN   Late entry Ignacia PalmaCathy Arleene Settle, OTR/L [phone] 09/28/2016

## 2016-09-28 NOTE — Progress Notes (Signed)
Pharmacy Antibiotic Note  Sean Palmer is a 26 y.o. male admitted on 09/19/2016 with sepsis.  Pharmacy has been consulted for vancomycin dosing. Pt also on meropenem and anidulafungin. Pt to complete 14 days of vanc/meropenem and 7 days of anidulafungin. Plan to discharge today to [facility].  Last vanc trough on 6/4 was at goal - 15. No changes were made. Pt needs another vanc trough if possible at [facility] ICU. Renal function has been stable.  Plan: - Continue vancomycin 750 mg IV q12h - Continue meropenem 1g IV q8h - Continue anidulafungin 100 mg q24h - Vanc trough if possible before 10am dose 6/8  Height: _0  (172.7 cm) Weight: 140 lb 6.9 oz (63.7 kg) IBW/kg (Calculated) : 68.4  Temp (24hrs), Avg:97.6 F (36.4 C), Min:97 F (36.1 C), Max:98.9 F (37.2 C)   Recent Labs Lab 09/21/16 2103  09/22/16 0859  09/24/16 0500 09/25/16 0430 09/25/16 2107 09/26/16 0345 09/27/16 0407 09/28/16 0400  WBC  --   < >  --   < > 10.5 8.2  --  20.7* 7.4 8.4  CREATININE  --   < >  --   < > <0.30* <0.30*  --  <0.30* <0.30* <0.30*  LATICACIDVEN 0.5  --  0.7  --   --   --   --   --   --   --   VANCOTROUGH  --   --   --   --   --   --  15  --   --   --   < > = values in this interval not displayed.  CrCl cannot be calculated (This lab value cannot be used to calculate CrCl because it is not a number: <0.30).    Allergies  Allergen Reactions  . Vancomycin Other (See Comments)    redmans syndrome  . Piperacillin Other (See Comments)    Resistant per [facility] paperwork    Antimicrobials this admission: Cefepime 5/29 >> 6/1 Zyvox 5/29 >> 5/31 Vanc 0100 6/1 >> (6/14 - entered) Merrem 6/1 >> (6/14 - entered) Eraxis 6/1 >> (6/7 - entered)  Microbiology results: 5/29 Blood - NGTD 5/29 Trach Asp- pending 5/29 MRSA PCR-  neg 5/30 Trach asp: multiple org 5/31 BCx:  ngtd 5/31 BAL: no org seen 6/1 UCx: multi species  Thank you for allowing pharmacy to be a part of this patient's  care.  Melburn Popper, PharmD Clinical Pharmacy Resident Pager: [phone] 09/28/16 10:44 AM

## 2016-09-28 NOTE — Progress Notes (Signed)
eLink Physician Progress Note and Electrolyte Replacement  Patient Name: Joseph ArtFrederick G Felmlee DOB: May 08, 1990 MRN: [id number]  Date of Service  09/28/2016   HPI/Events of Note    Recent Labs Lab 09/21/16 0930  09/24/16 0500 09/25/16 0430 09/26/16 0345 09/27/16 0407 09/28/16 0400  NA  --   < > 148* 149* 150* 149* 149*  K  --   < > 3.5 3.2* 2.9* 3.1* 3.3*  CL  --   < > 106 107 104 105 103  CO2  --   < > 37* 38* 38* 38* 39*  GLUCOSE  --   < > 147* 102* 142* 107* 113*  BUN  --   < > 16 20 18 17  24*  CREATININE  --   < > <0.30* <0.30* <0.30* <0.30* <0.30*  CALCIUM  --   < > 7.7* 7.9* 8.0* 8.0* 8.1*  MG  --   --   --  1.7 2.0 1.9  --   PHOS 2.9  --   --  1.0* 2.5  --   --   < > = values in this interval not displayed.  CrCl cannot be calculated (This lab value cannot be used to calculate CrCl because it is not a number: <0.30).  Intake/Output      06/06 0701 - 06/07 0700   Other 60   NG/GT 1820   IV Piggyback 730   Total Intake(mL/kg) 2610 (41)   Urine (mL/kg/hr) 525 (0.3)   Stool 75 (0)   Total Output 600   Net +2010        - I/O DETAILED x 24h    Total I/O In: 990 [NG/GT:740; IV Piggyback:250] Out: 265 [Urine:265] - I/O THIS SHIFT    ASSESSMENT Low k  eICURN Interventions  kcl 40 via tube   ASSESSMENT: MAJOR ELECTROLYTE      Dr. Kalman ShanMurali Bayler Nehring, M.D., [facility] CenterF.C.C.P Pulmonary and Critical Care Medicine Staff Physician [facility] Pager: [phone], If no answer or between  15:00h - 7:00h: call [phone]  09/28/2016 5:58 AM

## 2016-10-22 DEATH — deceased

## 2019-02-09 IMAGING — DX DG CHEST 1V PORT
1 series · 1 of 1 positions shown · non-contrast
Comparison: 08/05/2016, 08/02/2016

CLINICAL DATA: Respiratory failure

EXAM:
PORTABLE CHEST 1 VIEW

[chest ap]
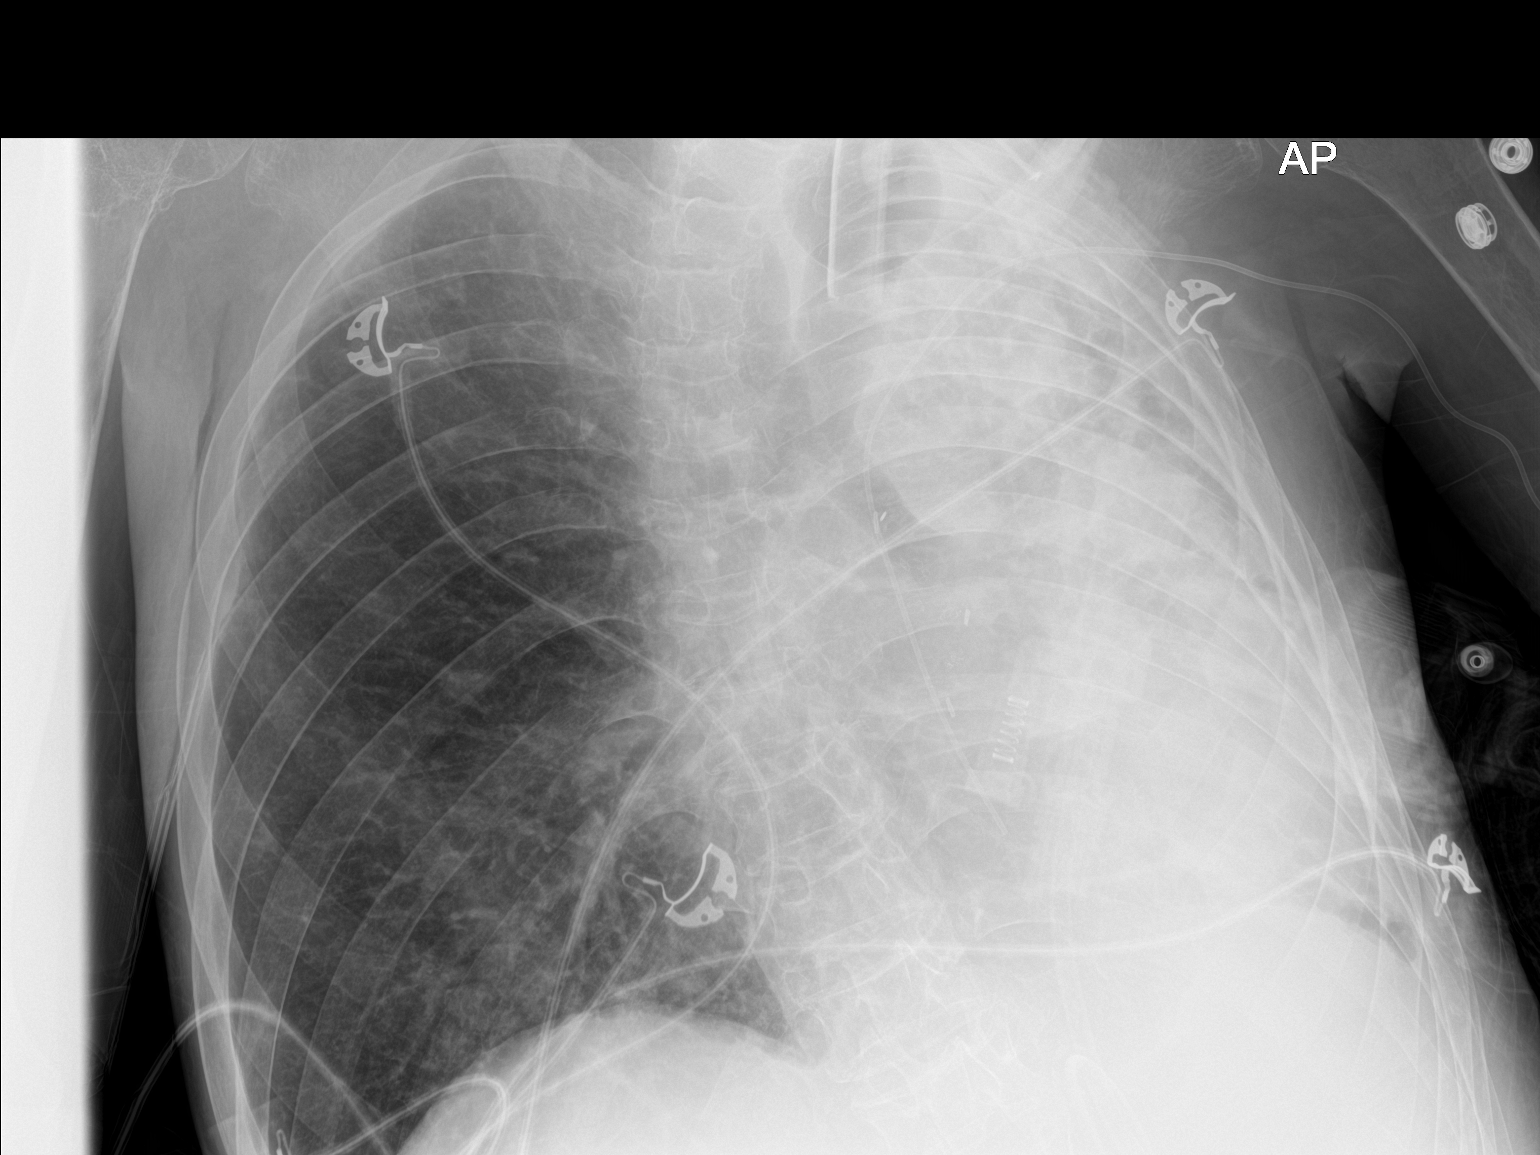

[1 of 1 positions shown; findings below may reference images not displayed]

FINDINGS: Considerable rotation to the left is again seen. Tracheostomy tube
and left-sided PICC line are again noted and stable. Postsurgical
changes in the cervical spine are seen. The right lung remains well
aerated. Mild increased density is noted in the right lung base
stable from the prior exam. Continued diffuse left-sided
opacification is noted similar to that seen on the prior exam.
IMPRESSION: Stable changes on the left.

Stable changes in the right lung base.
